# Patient Record
Sex: Female | Born: 1970 | Race: White | Hispanic: No | Marital: Single | Smoking: Never smoker
Health system: Southern US, Community
[De-identification: ages and names within clinical notes are randomized; demographics above are authoritative.]

## PROBLEM LIST (undated history)

## (undated) DIAGNOSIS — T7840XA Allergy, unspecified, initial encounter: Secondary | ICD-10-CM

## (undated) DIAGNOSIS — K219 Gastro-esophageal reflux disease without esophagitis: Secondary | ICD-10-CM

## (undated) DIAGNOSIS — F32A Depression, unspecified: Secondary | ICD-10-CM

## (undated) DIAGNOSIS — G4730 Sleep apnea, unspecified: Secondary | ICD-10-CM

## (undated) DIAGNOSIS — F329 Major depressive disorder, single episode, unspecified: Secondary | ICD-10-CM

## (undated) DIAGNOSIS — F909 Attention-deficit hyperactivity disorder, unspecified type: Secondary | ICD-10-CM

## (undated) DIAGNOSIS — J45909 Unspecified asthma, uncomplicated: Secondary | ICD-10-CM

## (undated) DIAGNOSIS — E559 Vitamin D deficiency, unspecified: Secondary | ICD-10-CM

## (undated) DIAGNOSIS — F419 Anxiety disorder, unspecified: Secondary | ICD-10-CM

## (undated) DIAGNOSIS — M1990 Unspecified osteoarthritis, unspecified site: Secondary | ICD-10-CM

## (undated) DIAGNOSIS — E119 Type 2 diabetes mellitus without complications: Secondary | ICD-10-CM

## (undated) DIAGNOSIS — E039 Hypothyroidism, unspecified: Secondary | ICD-10-CM

## (undated) HISTORY — DX: Major depressive disorder, single episode, unspecified: F32.9

## (undated) HISTORY — DX: Unspecified osteoarthritis, unspecified site: M19.90

## (undated) HISTORY — DX: Type 2 diabetes mellitus without complications: E11.9

## (undated) HISTORY — DX: Depression, unspecified: F32.A

## (undated) HISTORY — DX: Allergy, unspecified, initial encounter: T78.40XA

## (undated) HISTORY — DX: Vitamin D deficiency, unspecified: E55.9

---

## 1997-12-03 ENCOUNTER — Emergency Department (HOSPITAL_COMMUNITY): Admission: EM | Admit: 1997-12-03 | Discharge: 1997-12-03 | Payer: Self-pay | Admitting: Family Medicine

## 1997-12-05 ENCOUNTER — Encounter: Admission: RE | Admit: 1997-12-05 | Discharge: 1997-12-05 | Payer: Self-pay | Admitting: *Deleted

## 1997-12-09 ENCOUNTER — Emergency Department (HOSPITAL_COMMUNITY): Admission: EM | Admit: 1997-12-09 | Discharge: 1997-12-10 | Payer: Self-pay | Admitting: Emergency Medicine

## 2003-07-03 ENCOUNTER — Emergency Department (HOSPITAL_COMMUNITY): Admission: EM | Admit: 2003-07-03 | Discharge: 2003-07-03 | Payer: Self-pay | Admitting: Emergency Medicine

## 2003-07-05 ENCOUNTER — Ambulatory Visit (HOSPITAL_COMMUNITY): Admission: RE | Admit: 2003-07-05 | Discharge: 2003-07-05 | Payer: Self-pay | Admitting: Family Medicine

## 2004-02-20 ENCOUNTER — Emergency Department (HOSPITAL_COMMUNITY): Admission: EM | Admit: 2004-02-20 | Discharge: 2004-02-20 | Payer: Self-pay | Admitting: Emergency Medicine

## 2004-12-04 ENCOUNTER — Other Ambulatory Visit: Admission: RE | Admit: 2004-12-04 | Discharge: 2004-12-04 | Payer: Self-pay | Admitting: Family Medicine

## 2005-03-18 ENCOUNTER — Encounter: Admission: RE | Admit: 2005-03-18 | Discharge: 2005-03-18 | Payer: Self-pay | Admitting: Otolaryngology

## 2006-08-11 ENCOUNTER — Ambulatory Visit: Payer: Self-pay | Admitting: Family Medicine

## 2006-08-12 ENCOUNTER — Ambulatory Visit: Payer: Self-pay | Admitting: *Deleted

## 2006-09-01 ENCOUNTER — Ambulatory Visit: Payer: Self-pay | Admitting: Internal Medicine

## 2006-09-01 ENCOUNTER — Encounter (INDEPENDENT_AMBULATORY_CARE_PROVIDER_SITE_OTHER): Payer: Self-pay | Admitting: Family Medicine

## 2006-11-11 ENCOUNTER — Ambulatory Visit: Payer: Self-pay | Admitting: Family Medicine

## 2006-11-11 ENCOUNTER — Encounter (INDEPENDENT_AMBULATORY_CARE_PROVIDER_SITE_OTHER): Payer: Self-pay | Admitting: Family Medicine

## 2006-12-02 ENCOUNTER — Ambulatory Visit: Payer: Self-pay | Admitting: Family Medicine

## 2006-12-02 LAB — CONVERTED CEMR LAB
Free T4: 1.29 ng/dL (ref 0.89–1.80)
TSH: 7.456 microintl units/mL — ABNORMAL HIGH (ref 0.350–5.50)

## 2007-02-22 ENCOUNTER — Ambulatory Visit: Payer: Self-pay | Admitting: Family Medicine

## 2007-02-22 LAB — CONVERTED CEMR LAB: TSH: 7.145 microintl units/mL — ABNORMAL HIGH (ref 0.350–5.50)

## 2007-02-26 ENCOUNTER — Ambulatory Visit (HOSPITAL_COMMUNITY): Admission: RE | Admit: 2007-02-26 | Discharge: 2007-02-26 | Payer: Self-pay | Admitting: Family Medicine

## 2007-03-14 ENCOUNTER — Ambulatory Visit: Payer: Self-pay | Admitting: Family Medicine

## 2007-05-09 ENCOUNTER — Ambulatory Visit: Payer: Self-pay | Admitting: Family Medicine

## 2007-05-09 LAB — CONVERTED CEMR LAB: TSH: 1.539 microintl units/mL (ref 0.350–5.50)

## 2007-08-05 ENCOUNTER — Ambulatory Visit: Payer: Self-pay | Admitting: Internal Medicine

## 2007-09-06 ENCOUNTER — Ambulatory Visit: Payer: Self-pay | Admitting: Internal Medicine

## 2007-09-06 ENCOUNTER — Encounter (INDEPENDENT_AMBULATORY_CARE_PROVIDER_SITE_OTHER): Payer: Self-pay | Admitting: Family Medicine

## 2007-09-06 LAB — CONVERTED CEMR LAB
BUN: 10 mg/dL (ref 6–23)
Chloride: 105 meq/L (ref 96–112)
Creatinine, Ser: 0.55 mg/dL (ref 0.40–1.20)
Free T4: 1.24 ng/dL (ref 0.89–1.80)
Glucose, Bld: 97 mg/dL (ref 70–99)

## 2007-09-21 ENCOUNTER — Encounter (INDEPENDENT_AMBULATORY_CARE_PROVIDER_SITE_OTHER): Payer: Self-pay | Admitting: Family Medicine

## 2007-09-21 ENCOUNTER — Ambulatory Visit: Payer: Self-pay | Admitting: Internal Medicine

## 2007-09-21 LAB — CONVERTED CEMR LAB
LDL Cholesterol: 116 mg/dL — ABNORMAL HIGH (ref 0–99)
Total CHOL/HDL Ratio: 4.3
Triglycerides: 145 mg/dL (ref <150)

## 2007-09-26 ENCOUNTER — Emergency Department (HOSPITAL_COMMUNITY): Admission: EM | Admit: 2007-09-26 | Discharge: 2007-09-26 | Payer: Self-pay | Admitting: Emergency Medicine

## 2007-10-21 ENCOUNTER — Emergency Department (HOSPITAL_COMMUNITY): Admission: EM | Admit: 2007-10-21 | Discharge: 2007-10-21 | Payer: Self-pay | Admitting: Emergency Medicine

## 2008-01-31 ENCOUNTER — Encounter (INDEPENDENT_AMBULATORY_CARE_PROVIDER_SITE_OTHER): Payer: Self-pay | Admitting: Family Medicine

## 2008-01-31 ENCOUNTER — Ambulatory Visit: Payer: Self-pay | Admitting: Family Medicine

## 2008-01-31 LAB — CONVERTED CEMR LAB
Chlamydia, DNA Probe: NEGATIVE
GC Probe Amp, Genital: NEGATIVE

## 2008-03-30 ENCOUNTER — Ambulatory Visit: Payer: Self-pay | Admitting: Family Medicine

## 2008-04-16 ENCOUNTER — Emergency Department (HOSPITAL_COMMUNITY): Admission: EM | Admit: 2008-04-16 | Discharge: 2008-04-16 | Payer: Self-pay | Admitting: Emergency Medicine

## 2008-05-30 ENCOUNTER — Encounter: Admission: RE | Admit: 2008-05-30 | Discharge: 2008-05-30 | Payer: Self-pay | Admitting: Occupational Medicine

## 2008-08-25 ENCOUNTER — Ambulatory Visit (HOSPITAL_BASED_OUTPATIENT_CLINIC_OR_DEPARTMENT_OTHER): Admission: RE | Admit: 2008-08-25 | Discharge: 2008-08-25 | Payer: Self-pay | Admitting: Family Medicine

## 2008-09-02 ENCOUNTER — Ambulatory Visit: Payer: Self-pay | Admitting: Internal Medicine

## 2008-09-20 ENCOUNTER — Emergency Department (HOSPITAL_COMMUNITY): Admission: EM | Admit: 2008-09-20 | Discharge: 2008-09-20 | Payer: Self-pay | Admitting: Emergency Medicine

## 2008-09-25 ENCOUNTER — Emergency Department (HOSPITAL_COMMUNITY): Admission: EM | Admit: 2008-09-25 | Discharge: 2008-09-25 | Payer: Self-pay | Admitting: Emergency Medicine

## 2009-04-04 ENCOUNTER — Ambulatory Visit (HOSPITAL_COMMUNITY): Admission: RE | Admit: 2009-04-04 | Discharge: 2009-04-04 | Payer: Self-pay | Admitting: Family Medicine

## 2009-08-09 ENCOUNTER — Emergency Department (HOSPITAL_COMMUNITY): Admission: EM | Admit: 2009-08-09 | Discharge: 2009-08-09 | Payer: Self-pay | Admitting: Emergency Medicine

## 2010-03-04 IMAGING — CR DG ELBOW COMPLETE 3+V*L*
2 series · 2 of 2 positions shown · non-contrast
Comparison: None

CLINICAL DATA: Fell last night.  Left elbow pain.

LEFT ELBOW - COMPLETE 3+ VIEW

[view not recorded (1 of 2)]
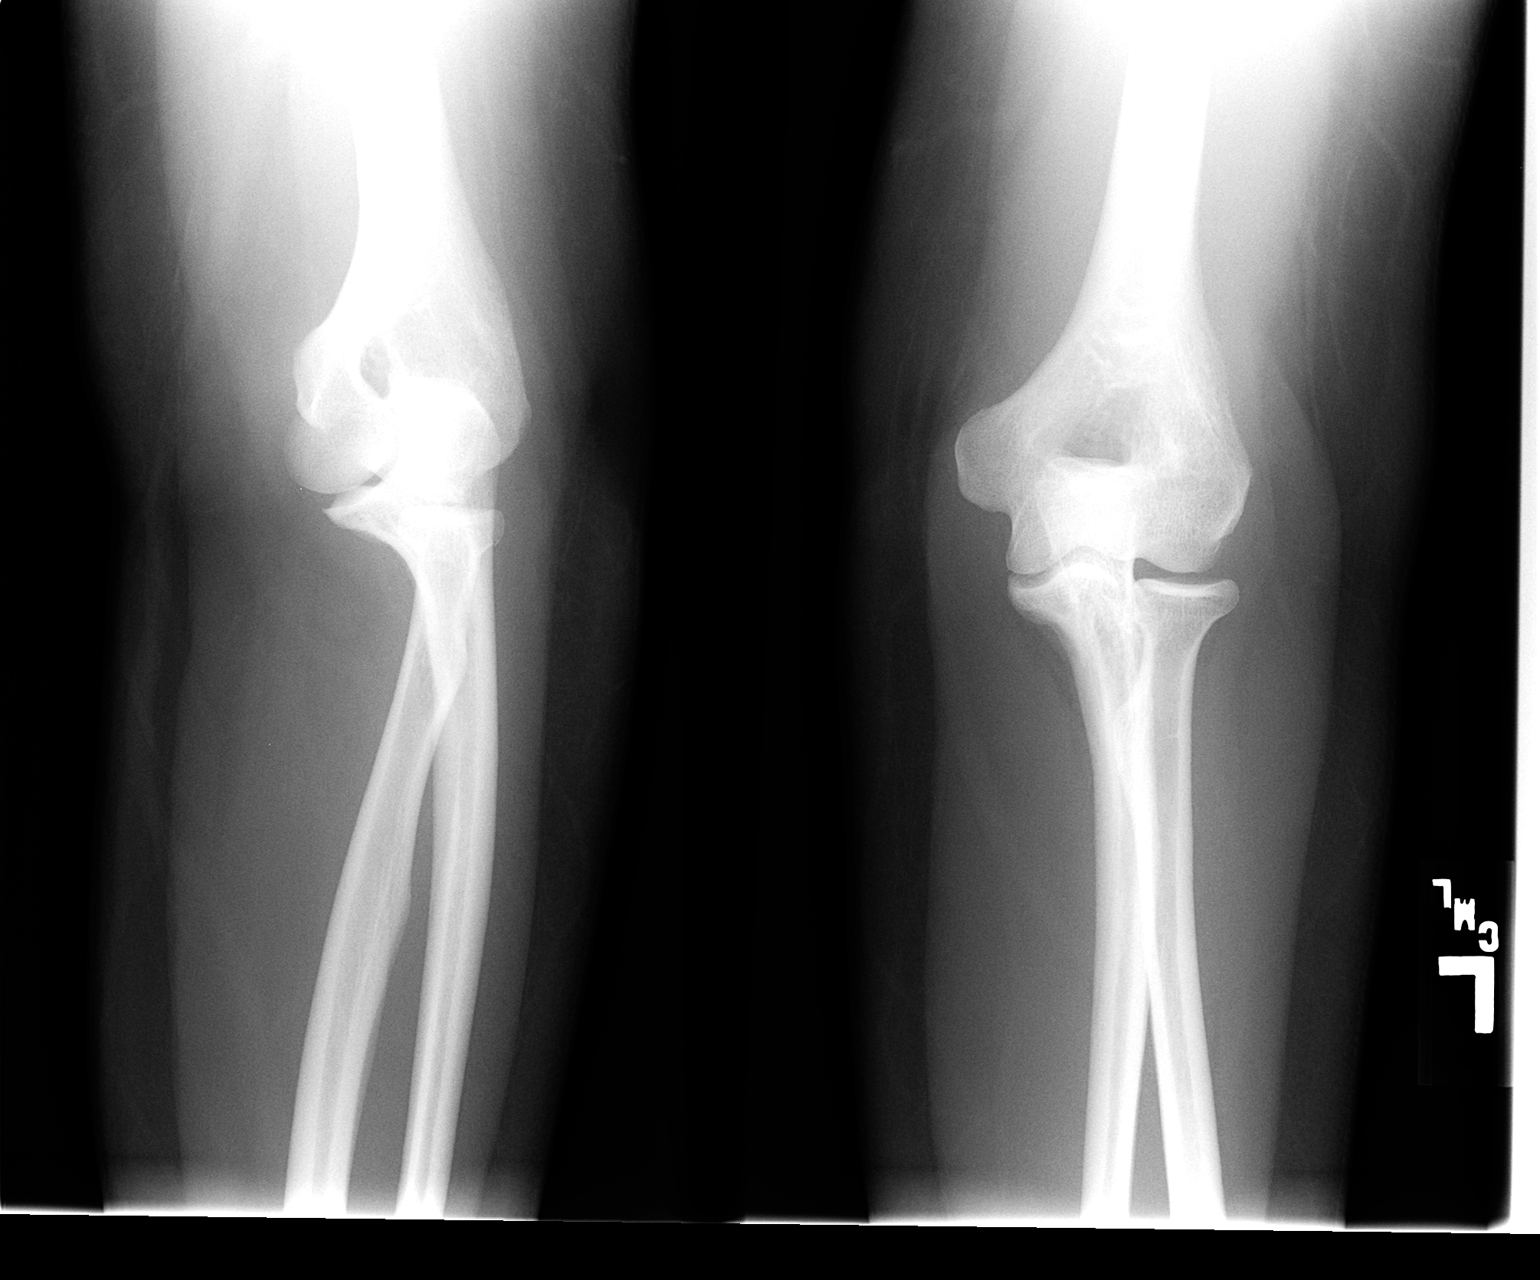

[view not recorded (2 of 2)]
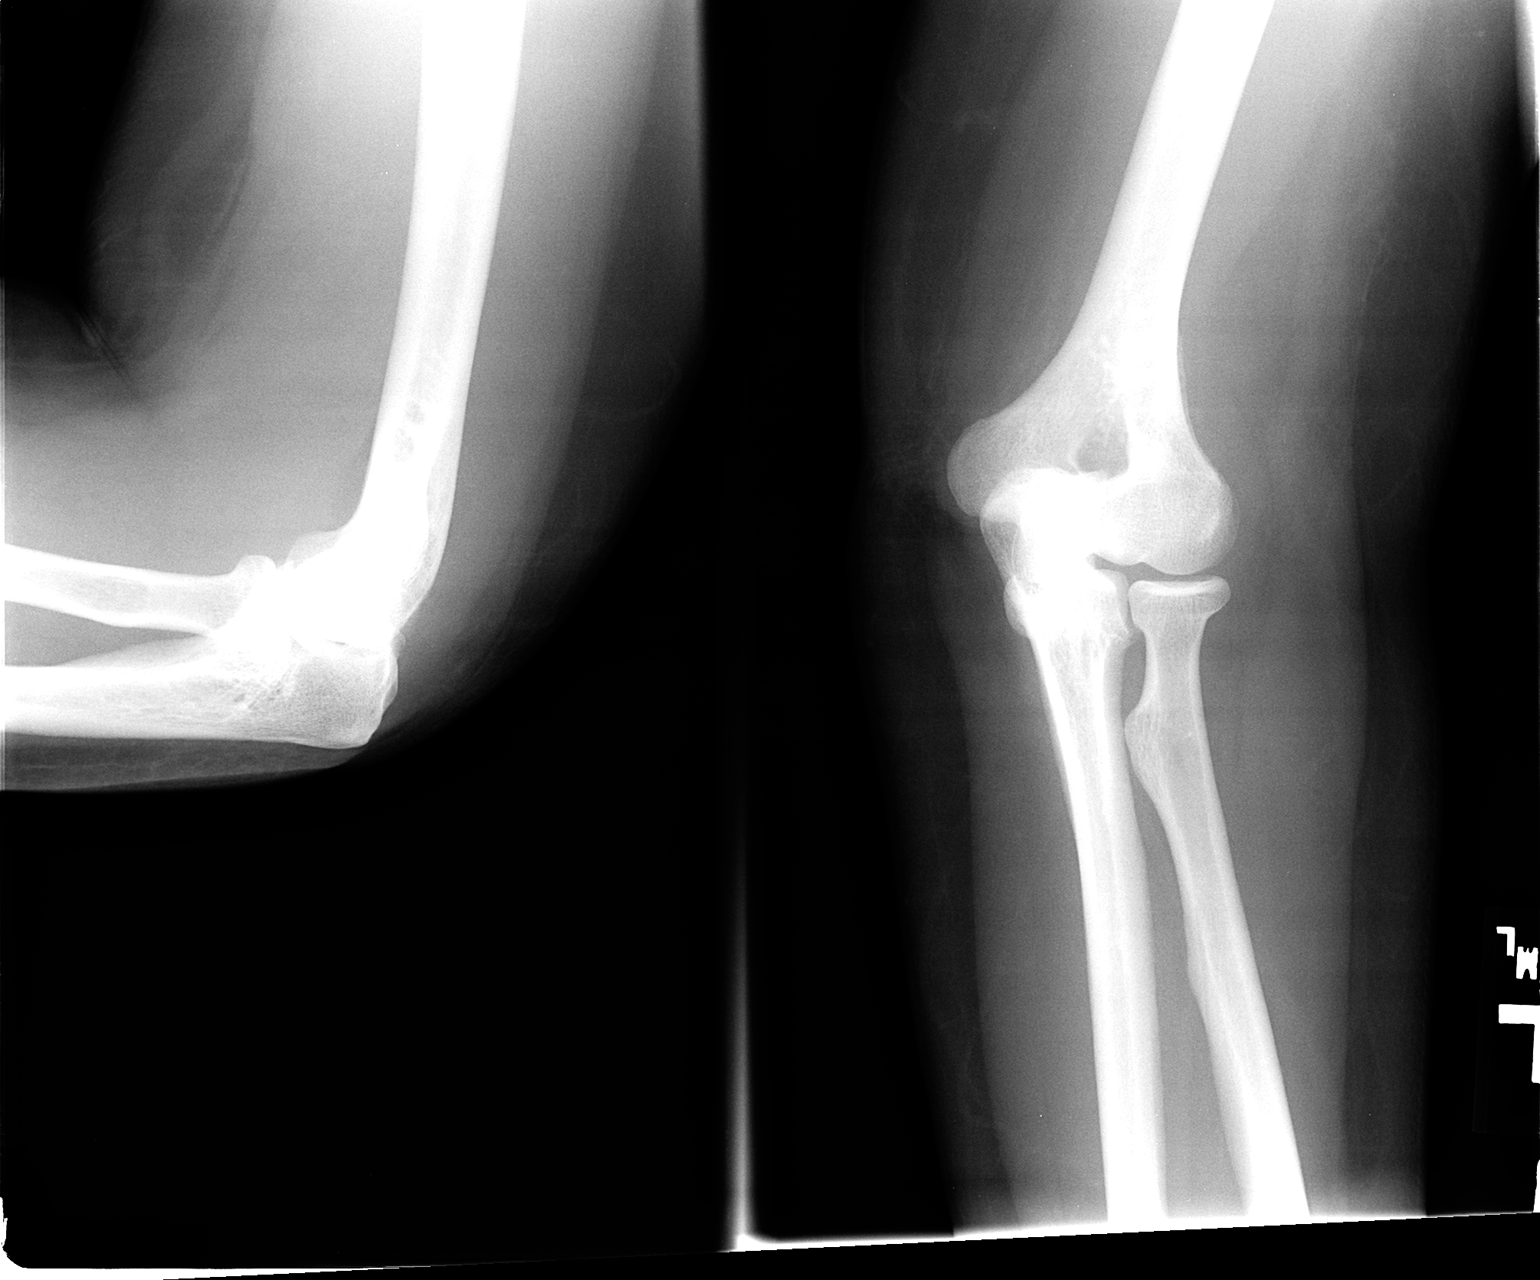

[2 of 2 positions shown; findings below may reference images not displayed]

FINDINGS: Prominent anterior fat pad but no evidence of a positive
posterior fat pad sign.  No fracture or subluxation is appreciated.
IMPRESSION: No acute bony abnormality.

## 2010-03-11 ENCOUNTER — Encounter (INDEPENDENT_AMBULATORY_CARE_PROVIDER_SITE_OTHER): Payer: Self-pay | Admitting: Family Medicine

## 2010-03-11 LAB — CONVERTED CEMR LAB
Albumin: 4.3 g/dL (ref 3.5–5.2)
Alkaline Phosphatase: 64 units/L (ref 39–117)
BUN: 11 mg/dL (ref 6–23)
Basophils Relative: 0 % (ref 0–1)
Calcium: 8.9 mg/dL (ref 8.4–10.5)
Chloride: 104 meq/L (ref 96–112)
Creatinine, Ser: 0.49 mg/dL (ref 0.40–1.20)
Eosinophils Absolute: 0.2 10*3/uL (ref 0.0–0.7)
Eosinophils Relative: 3 % (ref 0–5)
Free T4: 1.47 ng/dL (ref 0.80–1.80)
MCV: 86.2 fL (ref 78.0–100.0)
Monocytes Absolute: 0.5 10*3/uL (ref 0.1–1.0)
Monocytes Relative: 8 % (ref 3–12)
Potassium: 4.8 meq/L (ref 3.5–5.3)
TSH: 0.534 microintl units/mL (ref 0.350–4.500)
Vit D, 25-Hydroxy: 22 ng/mL — ABNORMAL LOW (ref 30–89)
WBC: 7.2 10*3/uL (ref 4.0–10.5)

## 2010-06-02 LAB — URINALYSIS, ROUTINE W REFLEX MICROSCOPIC
Nitrite: NEGATIVE
Urobilinogen, UA: 0.2 mg/dL (ref 0.0–1.0)

## 2010-06-02 LAB — CBC
HCT: 39.6 % (ref 36.0–46.0)
MCHC: 33.8 g/dL (ref 30.0–36.0)
MCV: 85.8 fL (ref 78.0–100.0)
Platelets: 292 10*3/uL (ref 150–400)
RBC: 4.62 MIL/uL (ref 3.87–5.11)
WBC: 9.8 10*3/uL (ref 4.0–10.5)

## 2010-06-02 LAB — URINE MICROSCOPIC-ADD ON

## 2010-06-02 LAB — WET PREP, GENITAL: Trich, Wet Prep: NONE SEEN

## 2010-06-02 LAB — GC/CHLAMYDIA PROBE AMP, GENITAL: GC Probe Amp, Genital: NEGATIVE

## 2010-08-01 NOTE — Procedures (Signed)
Kristi Ortiz, Kristi Ortiz              ACCOUNT NO.:  [id number]   MEDICAL RECORD NO.:  [id number]          PATIENT TYPE:  OUT   LOCATION:  SLEEP CENTER                 FACILITY:  [facility]   PHYSICIAN:  Clinton D. Maple Hudson, MD, FCCP, FACPDATE OF BIRTH:  Apr 26, 1971   DATE OF STUDY:                            NOCTURNAL POLYSOMNOGRAM   REFERRING PHYSICIAN:  Dario Guardian, M.D.   INDICATION FOR STUDY:  Hypersomnia with sleep apnea.   EPWORTH SLEEPINESS SCORE:  Epworth sleepiness score 14/24.  BMI 39.1.  Weight 235 pounds, height 65 inches.  Neck 16 inches.   MEDICATIONS:  Home medications are charted and reviewed.   SLEEP ARCHITECTURE:  Split study protocol.  During the diagnostic phase,  total sleep time 134 minutes with sleep efficiency 54.9%.  Stage I was  6%, stage II 74.3%, stage III absent, REM 19.8% of total sleep time.  Sleep latency 58.5 minutes, REM latency 84.5 minutes, awake-after-sleep  onset 37 minutes, arousal index 9.9.  No bedtime medication was taken.   RESPIRATORY DATA:  Split study protocol.  Apnea-hypopnea index (AHI)  89.1 per hour.  A total of 199 events were scored, all as hypopneas.  All events were associated with supine sleep.  REM AHI 74.7.  CPAP was  then titrated to 15 CWP, AHI 1 per hour.  She wore a small ResMed full-  face Quattro mask with heated humidifier.  Technician commented on  difficulty with titration because the patient was awake for long  periods.   OXYGEN DATA:  Loud snoring with oxygen desaturation before CPAP to a  nadir of 79%.  After CPAP control, snoring was prevented and oxygen  saturation held at the mean of 96.3% on room air.   CARDIAC DATA:  Normal sinus rhythm.   MOVEMENT-PARASOMNIA:  No movement disturbance.  Bathroom x1.   IMPRESSIONS-RECOMMENDATIONS:  1. Severe obstructive sleep apnea/hypopnea syndrome, AHI 89.1 per      hour.  All events were hypopneas associated with supine sleep.      Loud snoring with oxygen desaturation to  a nadir of 79%.  2. Successful CPAP titration to 15 CWP, AHI 1 per hour.  She wore a      small ResMed full-face Quattro mask with heated humidifier.      Clinton D. Maple Hudson, MD, [facility], FACP  Diplomate, Biomedical engineer of Sleep Medicine  Electronically Signed     CDY/MEDQ  D:  09/01/2008 10:53:23  T:  09/02/2008 00:03:23  Job:  [id number]

## 2011-11-19 ENCOUNTER — Encounter (HOSPITAL_COMMUNITY): Payer: Self-pay | Admitting: *Deleted

## 2011-11-19 ENCOUNTER — Emergency Department (INDEPENDENT_AMBULATORY_CARE_PROVIDER_SITE_OTHER)
Admission: EM | Admit: 2011-11-19 | Discharge: 2011-11-19 | Disposition: A | Discharge status: Home / Self Care | Payer: Self-pay | Source: Home / Self Care

## 2011-11-19 DIAGNOSIS — R0789 Other chest pain: Secondary | ICD-10-CM

## 2011-11-19 DIAGNOSIS — J069 Acute upper respiratory infection, unspecified: Secondary | ICD-10-CM

## 2011-11-19 DIAGNOSIS — R071 Chest pain on breathing: Secondary | ICD-10-CM

## 2011-11-19 HISTORY — DX: Unspecified asthma, uncomplicated: J45.909

## 2011-11-19 HISTORY — DX: Hypothyroidism, unspecified: E03.9

## 2011-11-19 NOTE — ED Provider Notes (Signed)
Medical screening examination/treatment/procedure(s) were performed by non-physician practitioner and as supervising physician I was immediately available for consultation/collaboration.  Raynald Blend, MD 11/19/11 1859

## 2011-11-19 NOTE — ED Provider Notes (Signed)
Medical screening examination/treatment/procedure(s) were performed by non-physician practitioner and as supervising physician I was immediately available for consultation/collaboration.  Cyerra Yim   Lelon Ikard, MD 11/19/11 1411 

## 2011-11-19 NOTE — ED Notes (Signed)
Pt is here with complaints of 1 month history of sore throat, non-productive cough and fever.  States temp 2 days ago was 101.2, has been using Aleve.

## 2011-11-19 NOTE — ED Provider Notes (Addendum)
History     CSN: [id number]  Arrival date & time 11/19/11  1009   None     Chief Complaint  Patient presents with  . Sore Throat  . Cough  . Fever    (Consider location/radiation/quality/duration/timing/severity/associated sxs/prior treatment) HPI Comments: C/o chest pain in anterior chest.   Patient is a 41 y.o. female presenting with URI. The history is provided by a parent.  URI The primary symptoms include fever, sore throat and cough. Primary symptoms do not include fatigue, headaches, ear pain, swollen glands, wheezing, abdominal pain, nausea, vomiting, myalgias, arthralgias or rash. The current episode started 6 to 7 days ago. This is a new problem.  Symptoms associated with the illness include congestion and rhinorrhea. The illness is not associated with chills, plugged ear sensation, facial pain or sinus pressure.    Past Medical History  Diagnosis Date  . Asthma   . Hypothyroid     History reviewed. No pertinent past surgical history.  History reviewed. No pertinent family history.  History  Substance Use Topics  . Smoking status: Never Smoker   . Smokeless tobacco: Not on file  . Alcohol Use: No    OB History    Grav Para Term Preterm Abortions TAB SAB Ect Mult Living                  Review of Systems  Constitutional: Positive for fever. Negative for chills and fatigue.  HENT: Positive for congestion, sore throat and rhinorrhea. Negative for ear pain and sinus pressure.   Eyes: Positive for itching. Negative for pain and redness.  Respiratory: Positive for cough. Negative for wheezing.   Cardiovascular: Negative for chest pain and leg swelling.  Gastrointestinal: Negative.  Negative for nausea, vomiting and abdominal pain.  Genitourinary: Negative.   Musculoskeletal: Negative for myalgias and arthralgias.  Skin: Negative for rash.  Neurological: Negative.  Negative for headaches.    Allergies  Review of patient's allergies indicates no known  allergies.  Home Medications   Current Outpatient Rx  Name Route Sig Dispense Refill  . LEVOTHYROXINE SODIUM 125 MCG PO TABS Oral Take 125 mcg by mouth daily.      BP 117/82  Pulse 105  Temp 98.5 F (36.9 C) (Oral)  Resp 18  SpO2 96%  LMP 10/29/2011  Physical Exam  Constitutional: She is oriented to person, place, and time. She appears well-developed and well-nourished. No distress.  HENT:  Right Ear: External ear normal.  Left Ear: External ear normal.  Mouth/Throat: Oropharynx is clear and moist. No oropharyngeal exudate.  Eyes: Conjunctivae are normal. Pupils are equal, round, and reactive to light.  Neck: Normal range of motion. Neck supple.  Cardiovascular: Normal rate.        Split S1   Pulmonary/Chest: Effort normal and breath sounds normal.  Musculoskeletal: Normal range of motion. She exhibits no tenderness.  Lymphadenopathy:    She has no cervical adenopathy.  Neurological: She is alert and oriented to person, place, and time.  Skin: Skin is warm and dry. She is not diaphoretic.  Psychiatric: She has a normal mood and affect.    ED Course  Procedures (including critical care time)   Labs Reviewed  POCT RAPID STREP A ([facility]RG CARE ONLY)   No results found.   1. URI (upper respiratory infection)   2. Chest wall pain       MDM  Claritin 10mg  q d, sudafed 10mg  q 4h prn congestion, Motrin 400mg  q 6h prn  chest wall pain.  Fluids, rest.  Strep test is Negative          Hayden Rasmussen, NP 11/19/11 1115  Hayden Rasmussen, NP 11/19/11 1605  Hayden Rasmussen, NP 11/19/11 1606

## 2012-08-11 ENCOUNTER — Ambulatory Visit: Payer: No Typology Code available for payment source | Attending: Family Medicine | Admitting: Internal Medicine

## 2012-08-11 VITALS — BP 133/81 | HR 77 | Temp 97.7°F | Resp 18 | Wt 268.8 lb

## 2012-08-11 DIAGNOSIS — E039 Hypothyroidism, unspecified: Secondary | ICD-10-CM | POA: Insufficient documentation

## 2012-08-11 DIAGNOSIS — Z91199 Patient's noncompliance with other medical treatment and regimen due to unspecified reason: Secondary | ICD-10-CM | POA: Insufficient documentation

## 2012-08-11 DIAGNOSIS — G4733 Obstructive sleep apnea (adult) (pediatric): Secondary | ICD-10-CM | POA: Insufficient documentation

## 2012-08-11 DIAGNOSIS — F329 Major depressive disorder, single episode, unspecified: Secondary | ICD-10-CM | POA: Insufficient documentation

## 2012-08-11 DIAGNOSIS — Z9119 Patient's noncompliance with other medical treatment and regimen: Secondary | ICD-10-CM | POA: Insufficient documentation

## 2012-08-11 DIAGNOSIS — J45909 Unspecified asthma, uncomplicated: Secondary | ICD-10-CM | POA: Insufficient documentation

## 2012-08-11 DIAGNOSIS — F3289 Other specified depressive episodes: Secondary | ICD-10-CM | POA: Insufficient documentation

## 2012-08-11 MED ORDER — LEVOTHYROXINE SODIUM 125 MCG PO TABS: 125.0000 ug | ORAL_TABLET | Freq: Every day | ORAL | Status: DC

## 2012-08-11 NOTE — Progress Notes (Signed)
Patient states has a thyroid problem Suffers from depression States she used to take citalopram but does not know the dose

## 2012-08-11 NOTE — Progress Notes (Signed)
Patient ID: Kristi Ortiz, female   DOB: [birth year], 42 y.o.   MRN: [id number] Patient Demographics  Kristi Ortiz, is a 42 y.o. female  WUJ:[id number]  NFA:[id number]  DOB - [birth year]  Chief Complaint  Patient presents with  . Depression  . Thyroid Problem        Subjective:   [name] today is here to establish primary care. Patient has a  Past Medical History  Diagnosis Date  . Asthma   . Hypothyroid   . Currently patient has no complaints. Patient has been out of Synthroid for the past 2 months, she also has a history of depression and was taking Celexa to a few weeks ago and has stopped taking them. She apparently sees a psychiatrist at [facility], and is planning to go seeing him to see if she needs to go back on Celexa.  She also has a history of obstructive sleep apnea and was on CPAP till 2 years ago, she claims the machine broke and has not had it replaced yet.unfortunately she is not interested in using CPAP again, she wants to try to lose weight. I advised her to risk of worsening shortness of breath, sudden death, A. fib and hypertension. Unfortunately she would like to see if her symptoms with this improve with weight loss  Patient has also has No headache, No chest pain, No abdominal pain,No Nausea, No new weakness tingling or numbness, No Cough or SOB.   Objective:    Filed Vitals:   08/11/12 1117  BP: 133/81  Pulse: 77  Temp: 97.7 F (36.5 C)  Resp: 18  Weight: 268 lb 12.8 oz (121.927 kg)  SpO2: 95%     ALLERGIES:  No Known Allergies  PAST MEDICAL HISTORY: Past Medical History  Diagnosis Date  . Asthma   . Hypothyroid     PAST SURGICAL HISTORY: No past surgical history on file.  FAMILY HISTORY: No family history of CAD  MEDICATIONS AT HOME: Prior to Admission medications   Medication Sig Start Date End Date Taking? Authorizing Provider  levothyroxine (SYNTHROID, LEVOTHROID) 125 MCG tablet Take 1 tablet (125 mcg total) by mouth  daily. 08/11/12   Shanker Levora Dredge, MD    SOCIAL HISTORY:   reports that she has never smoked. She does not have any smokeless tobacco history on file. She reports that she does not drink alcohol or use illicit drugs.  REVIEW OF SYSTEMS:  Constitutional:   No   Fevers, chills, fatigue.  HEENT:    No headaches, Sore throat,   Cardio-vascular: No chest pain,  Orthopnea, swelling in lower extremities, anasarca, palpitations  GI:  No abdominal pain, nausea, vomiting, diarrhea  Resp: No shortness of breath,  No coughing up of blood.No cough.No wheezing.  Skin:  no rash or lesions.  GU:  no dysuria, change in color of urine, no urgency or frequency.  No flank pain.  Musculoskeletal: No joint pain or swelling.  No decreased range of motion.  No back pain.  Psych: No change in mood or affect. No depression or anxiety.  No memory loss.   Exam  General appearance :Awake, alert, not in any distress. Speech Clear. Not toxic Looking HEENT: Atraumatic and Normocephalic, pupils equally reactive to light and accomodation Neck: supple, no JVD. No cervical lymphadenopathy.  Chest:Good air entry bilaterally, no added sounds  CVS: S1 S2 regular, no murmurs.  Abdomen: Bowel sounds present, Non tender and not distended with no gaurding, rigidity or rebound. Extremities: B/L Lower Ext shows no  edema, both legs are warm to touch Neurology: Awake alert, and oriented X 3, CN II-XII intact, Non focal Skin:No Rash Wounds:N/A    Data Review   CBC No results found for this basename: WBC, HGB, HCT, PLT, MCV, MCH, MCHC, RDW, NEUTRABS, LYMPHSABS, MONOABS, EOSABS, BASOSABS, BANDABS, BANDSABD,  in the last 168 hours  Chemistries   No results found for this basename: NA, K, CL, CO2, GLUCOSE, BUN, CREATININE, GFRCGP, CALCIUM, MG, AST, ALT, ALKPHOS, BILITOT,  in the last 168  hours ------------------------------------------------------------------------------------------------------------------ No results found for this basename: HGBA1C,  in the last 72 hours ------------------------------------------------------------------------------------------------------------------ No results found for this basename: CHOL, HDL, LDLCALC, TRIG, CHOLHDL, LDLDIRECT,  in the last 72 hours ------------------------------------------------------------------------------------------------------------------ No results found for this basename: TSH, T4TOTAL, FREET3, T3FREE, THYROIDAB,  in the last 72 hours ------------------------------------------------------------------------------------------------------------------ No results found for this basename: VITAMINB12, FOLATE, FERRITIN, TIBC, IRON, RETICCTPCT,  in the last 72 hours  Coagulation profile  No results found for this basename: INR, PROTIME,  in the last 168 hours    Assessment & Plan   #1 hypothyroidism - Check TSH today - Resume on and 25 mcg of Synthroid - Return to clinic in 3 weeks for lab check  #2 depression - Not interested in restarting Celexa, she claims she will followup with a psychiatrist. We agreed that we will only manage hypothyroid and other medical issues here in the clinic, her psychiatrist will manage her psychiatric issues.  #3 obstructive sleep apnea - Noncompliant with CPAP-not interested in resuming the CPAP again. Please see above  We'll get basic labs-CBC, cemented, TSH, A1c and lipids and then back to clinic in 3 weeks.

## 2012-08-12 ENCOUNTER — Telehealth: Payer: Self-pay | Admitting: *Deleted

## 2012-08-12 LAB — TSH: TSH: 135.221 u[IU]/mL — ABNORMAL HIGH (ref 0.350–4.500)

## 2012-08-12 NOTE — Telephone Encounter (Signed)
08/12/12 Patient walked into clinic stated that pharmacy would Not filled her prescription for Synthroid until they speak with MD Dr. Gean Birchwood spoke with [facility] pharmacy for clarification.Patient  Instructed to start taking medication today per Dr. Gean Birchwood patient verbalize And understand.  P.Wiliams,RN BSN MHA

## 2012-08-16 ENCOUNTER — Telehealth: Payer: Self-pay | Admitting: Family Medicine

## 2012-09-01 ENCOUNTER — Encounter: Payer: Self-pay | Admitting: Internal Medicine

## 2012-09-01 ENCOUNTER — Encounter (HOSPITAL_COMMUNITY): Payer: Self-pay | Admitting: Emergency Medicine

## 2012-09-01 ENCOUNTER — Observation Stay (HOSPITAL_COMMUNITY): Payer: No Typology Code available for payment source

## 2012-09-01 ENCOUNTER — Ambulatory Visit: Payer: No Typology Code available for payment source | Attending: Family Medicine | Admitting: Internal Medicine

## 2012-09-01 ENCOUNTER — Telehealth: Payer: Self-pay | Admitting: Family Medicine

## 2012-09-01 ENCOUNTER — Observation Stay (HOSPITAL_COMMUNITY)
Admission: EM | Admit: 2012-09-01 | Discharge: 2012-09-03 | Disposition: A | Discharge status: Home / Self Care | Payer: No Typology Code available for payment source | Attending: Internal Medicine | Admitting: Internal Medicine

## 2012-09-01 VITALS — BP 117/84 | HR 79 | Temp 97.9°F | Ht 65.0 in | Wt 264.4 lb

## 2012-09-01 DIAGNOSIS — R404 Transient alteration of awareness: Secondary | ICD-10-CM | POA: Insufficient documentation

## 2012-09-01 DIAGNOSIS — R609 Edema, unspecified: Secondary | ICD-10-CM | POA: Diagnosis present

## 2012-09-01 DIAGNOSIS — Z79899 Other long term (current) drug therapy: Secondary | ICD-10-CM | POA: Insufficient documentation

## 2012-09-01 DIAGNOSIS — Z91199 Patient's noncompliance with other medical treatment and regimen due to unspecified reason: Secondary | ICD-10-CM | POA: Insufficient documentation

## 2012-09-01 DIAGNOSIS — G4733 Obstructive sleep apnea (adult) (pediatric): Secondary | ICD-10-CM | POA: Insufficient documentation

## 2012-09-01 DIAGNOSIS — R413 Other amnesia: Secondary | ICD-10-CM | POA: Diagnosis present

## 2012-09-01 DIAGNOSIS — E039 Hypothyroidism, unspecified: Principal | ICD-10-CM | POA: Insufficient documentation

## 2012-09-01 DIAGNOSIS — R209 Unspecified disturbances of skin sensation: Secondary | ICD-10-CM | POA: Insufficient documentation

## 2012-09-01 DIAGNOSIS — R5383 Other fatigue: Secondary | ICD-10-CM | POA: Insufficient documentation

## 2012-09-01 DIAGNOSIS — Z9119 Patient's noncompliance with other medical treatment and regimen: Secondary | ICD-10-CM | POA: Insufficient documentation

## 2012-09-01 DIAGNOSIS — R5381 Other malaise: Secondary | ICD-10-CM | POA: Insufficient documentation

## 2012-09-01 DIAGNOSIS — J45909 Unspecified asthma, uncomplicated: Secondary | ICD-10-CM | POA: Insufficient documentation

## 2012-09-01 HISTORY — DX: Sleep apnea, unspecified: G47.30

## 2012-09-01 LAB — CREATININE, SERUM
Creatinine, Ser: 0.61 mg/dL (ref 0.50–1.10)
GFR calc Af Amer: >90 mL/min (ref >90)
GFR calc non Af Amer: >90 mL/min (ref >90)

## 2012-09-01 LAB — TSH: TSH: 5.908 u[IU]/mL — ABNORMAL HIGH (ref 0.350–4.500)

## 2012-09-01 LAB — CBC WITH DIFFERENTIAL/PLATELET
Basophils Absolute: 0 10*3/uL (ref 0.0–0.1)
Basophils Relative: 0 % (ref 0–1)
Eosinophils Relative: 4 % (ref 0–5)
HCT: 39.2 % (ref 36.0–46.0)
MCHC: 33.4 g/dL (ref 30.0–36.0)
MCV: 86.3 fL (ref 78.0–100.0)
Monocytes Absolute: 0.6 10*3/uL (ref 0.1–1.0)
Neutro Abs: 5 10*3/uL (ref 1.7–7.7)
Platelets: 260 10*3/uL (ref 150–400)
RDW: 15.4 % (ref 11.5–15.5)

## 2012-09-01 LAB — CBC
HCT: 39 % (ref 36.0–46.0)
Hemoglobin: 13.1 g/dL (ref 12.0–15.0)
MCHC: 33.6 g/dL (ref 30.0–36.0)
RBC: 4.53 MIL/uL (ref 3.87–5.11)
WBC: 8.3 10*3/uL (ref 4.0–10.5)

## 2012-09-01 LAB — BASIC METABOLIC PANEL
Calcium: 8.9 mg/dL (ref 8.4–10.5)
Creatinine, Ser: 0.54 mg/dL (ref 0.50–1.10)
GFR calc Af Amer: >90 mL/min (ref >90)
Sodium: 137 mEq/L (ref 135–145)

## 2012-09-01 LAB — ALBUMIN: Albumin: 3.7 g/dL (ref 3.5–5.2)

## 2012-09-01 MED ORDER — ALBUTEROL SULFATE (5 MG/ML) 0.5% IN NEBU
2.5000 mg | INHALATION_SOLUTION | RESPIRATORY_TRACT | Status: DC | PRN
  Administered 2012-09-01: 2.5 mg via RESPIRATORY_TRACT
  Filled 2012-09-01: qty 0.5

## 2012-09-01 MED ORDER — SODIUM CHLORIDE 0.9 % IJ SOLN
3.0000 mL | Freq: Two times a day (BID) | INTRAMUSCULAR | Status: DC
  Administered 2012-09-01 – 2012-09-02 (×2): 3 mL via INTRAVENOUS

## 2012-09-01 MED ORDER — ONDANSETRON HCL 4 MG/2ML IJ SOLN: 4.0000 mg | Freq: Three times a day (TID) | INTRAMUSCULAR | Status: DC | PRN

## 2012-09-01 MED ORDER — ACETAMINOPHEN 325 MG PO TABS
650.0000 mg | ORAL_TABLET | Freq: Four times a day (QID) | ORAL | Status: DC | PRN
  Administered 2012-09-01 – 2012-09-03 (×2): 650 mg via ORAL
  Filled 2012-09-01 (×2): qty 2

## 2012-09-01 MED ORDER — ONDANSETRON HCL 4 MG PO TABS: 4.0000 mg | ORAL_TABLET | Freq: Four times a day (QID) | ORAL | Status: DC | PRN

## 2012-09-01 MED ORDER — OXYCODONE HCL 5 MG PO TABS: 5.0000 mg | ORAL_TABLET | ORAL | Status: DC | PRN

## 2012-09-01 MED ORDER — MORPHINE SULFATE 2 MG/ML IJ SOLN: 2.0000 mg | INTRAMUSCULAR | Status: DC | PRN

## 2012-09-01 MED ORDER — ACETAMINOPHEN 650 MG RE SUPP: 650.0000 mg | Freq: Four times a day (QID) | RECTAL | Status: DC | PRN

## 2012-09-01 MED ORDER — HEPARIN SODIUM (PORCINE) 5000 UNIT/ML IJ SOLN
5000.0000 [IU] | Freq: Three times a day (TID) | INTRAMUSCULAR | Status: DC
  Administered 2012-09-01 – 2012-09-03 (×5): 5000 [IU] via SUBCUTANEOUS
  Filled 2012-09-01 (×8): qty 1

## 2012-09-01 MED ORDER — LEVOTHYROXINE SODIUM 125 MCG PO TABS
125.0000 ug | ORAL_TABLET | Freq: Every day | ORAL | Status: DC
  Administered 2012-09-02 – 2012-09-03 (×2): 125 ug via ORAL
  Filled 2012-09-01 (×3): qty 1

## 2012-09-01 MED ORDER — SODIUM CHLORIDE 0.9 % IJ SOLN: 3.0000 mL | INTRAMUSCULAR | Status: DC | PRN

## 2012-09-01 MED ORDER — ONDANSETRON HCL 4 MG/2ML IJ SOLN: 4.0000 mg | Freq: Four times a day (QID) | INTRAMUSCULAR | Status: DC | PRN

## 2012-09-01 MED ORDER — SODIUM CHLORIDE 0.9 % IV SOLN: 250.0000 mL | INTRAVENOUS | Status: DC | PRN

## 2012-09-01 NOTE — ED Notes (Signed)
Pt states she was off synthroid for 2 months and restarted synthroid a week ago.   Pt states she has been having intermittent lower back pain, fatigue, arm and leg soreness, foot and leg cramps, hand numbness after holding objects for extended time.  Pt states her legs are sensitive and have intermittent numbness. Pt states knees and feet are swollen.  Pt also states she has been having anxiety.  Pt states she was sent here from wellness clinic.

## 2012-09-01 NOTE — Progress Notes (Signed)
Pt placed on Auto CPAP via nasal mask, Pt tolerating well at this time, RT to monitor and assess as needed.

## 2012-09-01 NOTE — Patient Instructions (Signed)

## 2012-09-01 NOTE — Progress Notes (Signed)
   CARE MANAGEMENT ED NOTE 09/01/2012  Patient:  Kristi Ortiz, Kristi Ortiz   Account Number:  [id number]  Date Initiated:  09/01/2012  Documentation initiated by:  Radford Pax  Subjective/Objective Assessment:     Subjective/Objective Assessment Detail:     Action/Plan:   Action/Plan Detail:   Anticipated DC Date:       Status Recommendation to Physician:   Result of Recommendation:    Other ED Services  Consult Working Plan    DC Planning Services  CM consult  Other  Medication Assistance    Choice offered to / List presented to:            Status of service:  Completed, signed off  ED Comments:   ED Comments Detail:  [facility] consulted to see patient reagarding medication assistance and a new CPAP machine.  Patient informed EDCM that she gets her medications from [facility] for four dollars.  She also has discount prescription cards. Patient informed EDCM that she is currently on one medication.  Patient informed EDCM that she has not used her CPAP machine in a "long time."  [facility] informed patient that she may have to get another sleep apnea test before she gets a new machine because her sleep apnea could have gotten worse or better.  Patient verbalized understanding. [facility] provided patient with list of community resources for financial assistance.  Patient and family thankful for assistance.  No further needs at this time.

## 2012-09-01 NOTE — Progress Notes (Signed)
Patient ID: Kristi Ortiz, female   DOB: 11-Jul-1970, 42 y.o.   MRN: [id number]  CC: follow up TSH level  HPI: 42 year old female with past medical history of hypothyroidism and asthma who presented to our clinic for followup of TSH level. Patient reported extreme pain in the lower back especially in the morning when she wakes up and this happens at least twice a week. Patient reports fatigue, soreness in arms and legs, foot and leg cramps. Additional symptoms are tingling sensation in hands and feet and numbness in hands after holding objects for extended period of time. Patient also reports dropping items after holding of her period of time. Patient reports increasing swelling in the knees and feet especially on the left side of the body. She reports increasing headaches in the morning for the past couple of weeks. In addition, other complaints or easy frustration when trying to do something and something unexpected occurs. She reports trouble remembering faces and names of people she met and worked with in recent past. She reports trouble remembering certain words. Patient reports problems with speech and getting words and thoughts and out as well as easy distractibility. Note, about 3 weeks prior to this presentation patient came to our office for TSH check up which turns out to be 135. Prior to that 3 weeks she ran out of levothyroxine for about 2 months. She was given a prescription for levothyroxine 125 mcg daily which she started taking after our visit 3 weeks ago, 08/11/2012.  No Known Allergies Past Medical History  Diagnosis Date  . Asthma   . Hypothyroid    Current Outpatient Prescriptions on File Prior to Visit  Medication Sig Dispense Refill  . levothyroxine (SYNTHROID, LEVOTHROID) 125 MCG tablet Take 1 tablet (125 mcg total) by mouth daily.  30 tablet  2   No current facility-administered medications on file prior to visit.   Family medical history significant for HTN,  HLD   History   Social History  . Marital Status: Single    Spouse Name: N/A    Number of Children: N/A  . Years of Education: N/A   Occupational History  . Not on file.   Social History Main Topics  . Smoking status: Never Smoker   . Smokeless tobacco: Not on file  . Alcohol Use: No  . Drug Use: No  . Sexually Active: Not on file   Other Topics Concern  . Not on file   Social History Narrative  . No narrative on file    Review of Systems  Constitutional: Negative for fever, chills, diaphoresis, activity change, appetite change and fatigue.  HENT: Negative for ear pain, nosebleeds, congestion, facial swelling, rhinorrhea, neck pain, neck stiffness and ear discharge.   Eyes: Negative for pain, discharge, redness, itching and visual disturbance.  Respiratory: Negative for cough, choking, chest tightness, shortness of breath, wheezing and stridor.   Cardiovascular: Negative for chest pain, palpitations and leg swelling.  Gastrointestinal: Negative for abdominal distention.  Genitourinary: Negative for dysuria, urgency, frequency, hematuria, flank pain, decreased urine volume, difficulty urinating and dyspareunia.  Musculoskeletal: Positive for lower back pain. Positive for soreness in arms and legs, foot and leg cramps.  Neurological: Positive for tingling sensation in hands and feet, numbness in hands; memory loss; speech difficulty  Hematological: Negative for adenopathy. Does not bruise/bleed easily.  Psychiatric/Behavioral: Positive for distractibility, depression    Objective:   Filed Vitals:   09/01/12 1033  BP: 117/84  Pulse: 79  Temp: 97.9 F (36.6  C)    Physical Exam  Constitutional: Appears well-developed and well-nourished. No distress. Slow speech, obese HENT: Normocephalic. External right and left ear normal. Oropharynx is clear and moist.  Eyes: Conjunctivae and EOM are normal. PERRLA, no scleral icterus.  Neck: Normal ROM. Neck supple. No JVD. No  tracheal deviation. No thyromegaly.  CVS: RRR, S1/S2 +, no murmurs, no gallops, no carotid bruit.  Pulmonary: Effort and breath sounds normal, no stridor, rhonchi, wheezes, rales.  Abdominal: Soft. BS +,  no distension, tenderness, rebound or guarding.  Musculoskeletal: Normal range of motion. No edema and no tenderness.  Lymphadenopathy: No lymphadenopathy noted, cervical, inguinal. Neuro: Alert. Normal reflexes, muscle tone coordination. No cranial nerve deficit. Skin: Skin is warm and dry. No rash noted. Not diaphoretic. No erythema. No pallor.  Psychiatric: Normal mood and affect. Behavior, judgment, thought content normal.   Lab Results  Component Value Date   WBC 7.2 03/11/2010   HGB 14.4 03/11/2010   HCT 44.3 03/11/2010   MCV 86.2 03/11/2010   PLT 323 03/11/2010   Lab Results  Component Value Date   CREATININE 0.49 03/11/2010   BUN 11 03/11/2010   NA 141 03/11/2010   K 4.8 03/11/2010   CL 104 03/11/2010   CO2 26 03/11/2010    No results found for this basename: HGBA1C   Lipid Panel     Component Value Date/Time   CHOL 189 09/21/2007 2042   TRIG 145 09/21/2007 2042   HDL 44 09/21/2007 2042   CHOLHDL 4.3 Ratio 09/21/2007 2042   VLDL 29 09/21/2007 2042   LDLCALC 116* 09/21/2007 2042       Assessment and plan:   Patient Active Problem List   Diagnosis Date Noted  . Unspecified hypothyroidism - TSH level 08/11/2012 was 135. Patient was instructed to go to emergency room for further evaluation considering she has multiple symptoms related to hypothyroidism and uncontrolled TSH level  - Please note that we have reviewed the lipid panel. LDL is 116, slightly above the goal of less than 100. Patient will go to emergency room for further evaluation. Once TSH normalizes then we will recheck lipid panel and add anti-hyperlipidemics as needed. 08/11/2012

## 2012-09-01 NOTE — ED Provider Notes (Signed)
History     CSN: [id number]  Arrival date & time 09/01/12  1107   First MD Initiated Contact with Patient 09/01/12 1118      Chief Complaint  Patient presents with  . Thyroid Problem  . Back Pain  . Numbness    (Consider location/radiation/quality/duration/timing/severity/associated sxs/prior treatment) HPI Comments: Patient is a 42 year old female with a past medical history of hypothyroidism who presents with 2 month history of multiple complaints. Symptoms started gradually and progressively worsened since the onset. Patient reports intermittent low back pain, arm and leg soreness, foot and leg cramps, hand numbness after holding objects for extended periods of time, sensitive legs with intermittent numbness, knee and feet swelling, forgetfulness, can't remember faces, and trouble speaking. Patient was seen at her PCP office this morning and was recommended to come to the ED for further evaluation. Dr. Elisabeth Pigeon is concerned the patient is going into a myxedema coma. No aggravating/alleviating factors.    Past Medical History  Diagnosis Date  . Asthma   . Hypothyroid   . Sleep apnea     History reviewed. No pertinent past surgical history.  History reviewed. No pertinent family history.  History  Substance Use Topics  . Smoking status: Never Smoker   . Smokeless tobacco: Not on file  . Alcohol Use: No    OB History   Grav Para Term Preterm Abortions TAB SAB Ect Mult Living                  Review of Systems  Musculoskeletal: Positive for joint swelling.  Neurological: Positive for speech difficulty, weakness and numbness.  All other systems reviewed and are negative.    Allergies  Review of patient's allergies indicates no known allergies.  Home Medications   Current Outpatient Rx  Name  Route  Sig  Dispense  Refill  . ibuprofen (ADVIL,MOTRIN) 200 MG tablet   Oral   Take 200-400 mg by mouth every 6 (six) hours as needed for pain.         Marland Kitchen levothyroxine  (SYNTHROID, LEVOTHROID) 125 MCG tablet   Oral   Take 125 mcg by mouth daily before breakfast.           BP 116/92  Pulse 86  Temp(Src) 97.7 F (36.5 C) (Oral)  Resp 16  SpO2 94%  Physical Exam  Nursing note and vitals reviewed. Constitutional: She is oriented to person, place, and time. She appears well-developed and well-nourished. No distress.  HENT:  Head: Normocephalic and atraumatic.  Eyes: Conjunctivae and EOM are normal. Pupils are equal, round, and reactive to light.  Neck: Normal range of motion.  Cardiovascular: Normal rate and regular rhythm.  Exam reveals no gallop and no friction rub.   No murmur heard. Pulmonary/Chest: Effort normal and breath sounds normal. She has no wheezes. She has no rales. She exhibits no tenderness.  Abdominal: Soft. There is no tenderness.  Musculoskeletal: Normal range of motion.  Neurological: She is alert and oriented to person, place, and time. Coordination normal.  Speech is slow. Moves limbs without ataxia.   Skin: Skin is warm and dry.  Psychiatric: She has a normal mood and affect. Her behavior is normal.    ED Course  Procedures (including critical care time)  Labs Reviewed  CBC WITH DIFFERENTIAL  BASIC METABOLIC PANEL   No results found.   1. Hypothyroidism       MDM  2:01 PM Labs pending. I spoke with Dr. Elisabeth Pigeon from the [facility] who is concerned the patient is going into a myxedema coma. Patient will be admitted for monitoring and further evaluation.   3:29 PM Labs unremarkable. Patient will be admitted by Dr. Brien Few for further evaluation. He will consult endocrine for the patient.       Emilia Beck, PA-C 09/01/12 [phone]

## 2012-09-01 NOTE — H&P (Signed)
PCP:   Standley Dakins, MD   Chief Complaint:  Paresthesiae, in hands and feet, cramps, memory difficulties and fatigue.   HPI: This is a 42 year old female, with known history of bronchial asthma, hypothyroidism, OSA diagnosed via PSG several years ago, not using CPAP, after her machine broke down in 2008, referred to the ED on 09/01/12, by Dr Manson Passey for further evaluation, after she presented at the [facility], with complaints of fatigue, soreness/cramps her extremities, tingling sensation/numbness in hands and feet, over the past couple of months. As a matter of fact, she occasionally drops objects, due to "numbness", although simply because she falls asleep while holding them. She has noticed swelling in the knees and and ankles of both feet over the past few weeks, with some soreness, but no difficulty in ambulation. For the past several years, she has had trouble remembering faces and names of people she met and worked with in recent past, and remembering certain words. The same goes for problems with speech and getting words and thought out. Per patient, she ran out of Synthroid for about 2 months, after [facility] closed, then was given a prescription for Synthroid 125 mcg daily, at the [facility] on the 08/11/12, and has been compliant with her medication since then. TSH on 08/11/12 was 135.221.     Allergies:  No Known Allergies    Past Medical History  Diagnosis Date  . Asthma   . Hypothyroid   . Sleep apnea     Past Surgical History  Procedure Laterality Date  . Dental surgery      Prior to Admission medications   Medication Sig Start Date End Date Taking? Authorizing Provider  ibuprofen (ADVIL,MOTRIN) 200 MG tablet Take 200-400 mg by mouth every 6 (six) hours as needed for pain.   Yes Historical Provider, MD  levothyroxine (SYNTHROID, LEVOTHROID) 125 MCG tablet Take 125 mcg by mouth daily before breakfast.   Yes Historical Provider, MD    Social  History: Patient reports that she has never smoked. She does not have any smokeless tobacco history on file. She reports that she does not drink alcohol or use illicit drugs.  Family History: Father is aged 53 years, with no medical problems. Mother is aged 76 years with DM-2 and HTN.  Review of Systems:  As per HPI and chief complaint. Patent denies fatigue, diminished appetite, weight loss, fever, chills, headache, blurred vision, difficulty in speaking, dysphagia, chest pain, cough, shortness of breath, orthopnea, paroxysmal nocturnal dyspnea, nausea, diaphoresis, abdominal pain, vomiting, diarrhea, belching, heartburn, hematemesis, melena, dysuria, nocturia, urinary frequency, hematochezia, lower extremity pain, or redness. The rest of the systems review is negative.  Physical Exam:  General:  Patient does not appear to be in obvious acute distress. Alert, communicative, fully oriented, talking in complete sentences, not short of breath at rest.  HEENT:  No clinical pallor, no jaundice, no conjunctival injection or discharge. NECK:  Supple, JVP not seen, no carotid bruits, no palpable lymphadenopathy, no palpable goiter. CHEST:  Clinically clear to auscultation, no wheezes, no crackles. HEART:  Sounds 1 and 2 heard, normal, regular, no murmurs. ABDOMEN:  Morbidly obese, soft, non-tender, no palpable organomegaly, no palpable masses, normal bowel sounds. GENITALIA:  Not examined. LOWER EXTREMITIES:  Moderate pitting edema, palpable peripheral pulses. MUSCULOSKELETAL SYSTEM:  Unremarkable. CENTRAL NERVOUS SYSTEM:  No focal neurologic deficit on gross examination.  Labs on Admission:  Results for orders placed during the hospital encounter of 09/01/12 (from the past 48 hour(s))  CBC WITH DIFFERENTIAL     Status: None   Collection Time    09/01/12  2:15 PM      Result Value Range   WBC 7.7  4.0 - 10.5 K/uL   RBC 4.54  3.87 - 5.11 MIL/uL   Hemoglobin 13.1  12.0 - 15.0 g/dL   HCT 16.1   09.6 - 04.5 %   MCV 86.3  78.0 - 100.0 fL   MCH 28.9  26.0 - 34.0 pg   MCHC 33.4  30.0 - 36.0 g/dL   RDW 40.9  81.1 - 91.4 %   Platelets 260  150 - 400 K/uL   Neutrophils Relative % 65  43 - 77 %   Neutro Abs 5.0  1.7 - 7.7 K/uL   Lymphocytes Relative 23  12 - 46 %   Lymphs Abs 1.8  0.7 - 4.0 K/uL   Monocytes Relative 8  3 - 12 %   Monocytes Absolute 0.6  0.1 - 1.0 K/uL   Eosinophils Relative 4  0 - 5 %   Eosinophils Absolute 0.3  0.0 - 0.7 K/uL   Basophils Relative 0  0 - 1 %   Basophils Absolute 0.0  0.0 - 0.1 K/uL  BASIC METABOLIC PANEL     Status: None   Collection Time    09/01/12  2:15 PM      Result Value Range   Sodium 137  135 - 145 mEq/L   Potassium 4.0  3.5 - 5.1 mEq/L   Chloride 102  96 - 112 mEq/L   CO2 25  19 - 32 mEq/L   Glucose, Bld 83  70 - 99 mg/dL   BUN 10  6 - 23 mg/dL   Creatinine, Ser 7.82  0.50 - 1.10 mg/dL   Calcium 8.9  8.4 - 95.6 mg/dL   GFR calc non Af Amer >90  >90 mL/min   GFR calc Af Amer >90  >90 mL/min   Comment:            The eGFR has been calculated     using the CKD EPI equation.     This calculation has not been     validated in all clinical     situations.     eGFR's persistently     <90 mL/min signify     possible Chronic Kidney Disease.    Radiological Exams on Admission: No results found.  Assessment/Plan Active Problems:    1. Hypothyroidism: Patient has known hypothyroidism, and was documented on 08/11/12, to have a TSH of 135. 221, due to a 21-month period of non-compliance. Per patient, she has been compliant with her medication, since restarting Synthroid on 08/11/12. She has non-specific symptoms of fatigueability and tingling paresthesiae, so she still might be dysthyroid. We shall re-check TSH, but continue current dose of Synthroid for now. Some adjustment may be needed. Patient is neither constipated or bradycardic. In addition, we shall check ACTH and urinary free cortisol.  2. OSA (obstructive sleep apnea): This was  diagnosed several years ago, and per patient and her parent, who were present in the ED, confirmed by PSG. She has been non-compliant with CPAP however, since 2008, as her machine broke down. Untreated OSA is no doubt contributory for at least some of her symptoms of fatigue, somnolence and dropping objects. We shall commence nocturnal CPAP.  3. Edema: Patient has at least moderate bilateral LE edema. 2D Echocardiogram is in order, but for completeness, will check albumin levels.  4. Memory difficulty: This appears to be a lon-standing problem. Will evaluate with brain MRI.  5. Query Asthma: Patient does not appear certain of this diagnosis, and certainly, was not on bronchodilators pre-admission. Chest auscultation is devoid of wheeze or rhonchi. Will observe for now.   Further management will depend on clinical course.   Comment: Patient is FULL CODE.     Time Spent on Admission: 45 mins.   Akeira Lahm,CHRISTOPHER 09/01/2012, 4:21 PM

## 2012-09-01 NOTE — Progress Notes (Signed)
[facility] has seen patient. Pt does have a [employer] through [employer]. Pt stated that they are the ones who told her to come to the hospital.

## 2012-09-02 DIAGNOSIS — I519 Heart disease, unspecified: Secondary | ICD-10-CM

## 2012-09-02 LAB — CBC
MCV: 87 fL (ref 78.0–100.0)
Platelets: 247 10*3/uL (ref 150–400)
RBC: 4.37 MIL/uL (ref 3.87–5.11)
WBC: 6.4 10*3/uL (ref 4.0–10.5)

## 2012-09-02 LAB — BASIC METABOLIC PANEL
CO2: 27 mEq/L (ref 19–32)
Calcium: 8.5 mg/dL (ref 8.4–10.5)
Chloride: 103 mEq/L (ref 96–112)
Sodium: 136 mEq/L (ref 135–145)

## 2012-09-02 LAB — ACTH: C206 ACTH: 13 pg/mL (ref 10–46)

## 2012-09-02 LAB — VITAMIN B12: Vitamin B-12: 340 pg/mL (ref 211–911)

## 2012-09-02 NOTE — Progress Notes (Signed)
*  PRELIMINARY RESULTS* Echocardiogram 2D Echocardiogram has been performed.  Kristi Ortiz 09/02/2012, 2:41 PM

## 2012-09-02 NOTE — Progress Notes (Addendum)
Was asked again to see pt about getting a CPAP machine. 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 She stated she does not want to get one until they have taken care of the roach problem and that they are working on it. She is wanting prices of wedge pillows from [facility] which I have been told range from $54.99-$69.99. To replace the CPAP machine with a refurbished one is $100.00 and she needs to call [phone] to get the process started. Pt informed of all this. Dr Brien Few made aware as well.  Algernon Huxley RN BSN  [phone]

## 2012-09-02 NOTE — Progress Notes (Signed)
[facility] PROGRESS NOTE  Kristi Ortiz JWJ:[id number] DOB: [birth year] DOA: 09/01/2012 PCP: Standley Dakins, MD  Assessment/Plan: Active Problems:   Hypothyroidism   OSA (obstructive sleep apnea)   Memory difficulty   Asthma   Edema    1. Hypothyroidism: Patient has known hypothyroidism, and was documented on 08/11/12, to have a TSH of 135. 221, due to a 69-month period of non-compliance. Per patient, she has been compliant with her medication, since restarting Synthroid on 08/11/12. She has non-specific symptoms of fatigueability and tingling paresthesiae. TSH is now 5.908, so we shall continue dose of Synthroid for now. Some adjustment may be needed, but this can be addressed on follow up with her PMD. Recommend repeat TSH after 2-3 weeks. Patient is neither constipated or bradycardic. ACTH and urinary free cortisol are pending.  2. OSA (obstructive sleep apnea): This was diagnosed several years ago, and per patient and her parent, who were present in the ED, confirmed by PSG. She has been non-compliant with CPAP however, since 2008, as her machine broke down. Untreated OSA is no doubt contributory for at least some of her symptoms of fatigue, somnolence and dropping objects. Commenced on nocturnal CPAP, which she tolerated. Efforts are in progress to assist patient obtain a CPAP machine.  3. Edema: Patient has at least moderate bilateral LE edema. 2D Echocardiogram is pending. Albumin levels is reasonable at 3.7.  4. Memory difficulty: This appears to be a log-standing problem. Brain MRI is negative. Patient has been reassured accordingly. Will obtain vit B12/Folate levels.  5. Query Asthma: Patient does not appear certain of this diagnosis, and certainly, was not on bronchodilators pre-admission. Chest auscultation is devoid of wheeze or rhonchi. Will observe for now. She currently has no respiratory tract symptoms.    Code Status: Full Code.  Family Communication: Family fully  updated at bedside.  Disposition Plan: Aiming possible DC on 09/03/12.    Brief narrative: This is a 42 year old female, with known history of bronchial asthma, hypothyroidism, OSA diagnosed via PSG several years ago, not using CPAP, after her machine broke down in 2008, referred to the ED on 09/01/12, by Dr Manson Passey for further evaluation, after she presented at the [facility], with complaints of fatigue, soreness/cramps her extremities, tingling sensation/numbness in hands and feet, over the past couple of months. As a matter of fact, she occasionally drops objects, due to "numbness", although simply because she falls asleep while holding them. She has noticed swelling in the knees and and ankles of both feet over the past few weeks, with some soreness, but no difficulty in ambulation. For the past several years, she has had trouble remembering faces and names of people she met and worked with in recent past, and remembering certain words. The same goes for problems with speech and getting words and thought out. Per patient, she ran out of Synthroid for about 2 months, after [facility] closed, then was given a prescription for Synthroid 125 mcg daily, at the [facility] on the 08/11/12, and has been compliant with her medication since then. TSH on 08/11/12 was 135.221.    Consultants:  N/A.   Procedures:  Brain MRI.   Antibiotics: N/A.  HPI/Subjective: No new issues.   Objective: Vital signs in last 24 hours: Temp:  [97.8 F (36.6 C)-98.5 F (36.9 C)] 98.5 F (36.9 C) (06/19 2200) Pulse Rate:  [81-88] 81 (06/19 2200) Resp:  [20] 20 (06/19 2200) BP: (112-134)/(78-86) 112/78 mmHg (06/19 2200) SpO2:  [92 %-98 %]  92 % (06/19 2200) Weight:  [54.296 kg (119 lb 11.2 oz)-120.2 kg (264 lb 15.9 oz)] 120.2 kg (264 lb 15.9 oz) (06/20 1148) Weight change:  Last BM Date: 09/01/12  Intake/Output from previous day: 06/19 0701 - 06/20 0700 In: 240 [P.O.:240] Out: 1 [Urine:1] Total  I/O In: 240 [P.O.:240] Out: 2 [Urine:1; Stool:1]   Physical Exam: General: Patient does not appear to be in obvious acute distress. Alert, communicative, fully oriented, talking in complete sentences, not short of breath at rest.  HEENT: No clinical pallor, no jaundice, no conjunctival injection or discharge.  NECK: Supple, JVP not seen, no carotid bruits, no palpable lymphadenopathy, no palpable goiter.  CHEST: Clinically clear to auscultation, no wheezes, no crackles.  HEART: Sounds 1 and 2 heard, normal, regular, no murmurs.  ABDOMEN: Morbidly obese, soft, non-tender, no palpable organomegaly, no palpable masses, normal bowel sounds.  GENITALIA: Not examined.  LOWER EXTREMITIES: Moderate pitting edema, palpable peripheral pulses.  MUSCULOSKELETAL SYSTEM: Unremarkable.  CENTRAL NERVOUS SYSTEM: No focal neurologic deficit on gross examination.   Lab Results:  Recent Labs  09/01/12 1710 09/02/12 0559  WBC 8.3 6.4  HGB 13.1 12.4  HCT 39.0 38.0  PLT 261 247    Recent Labs  09/01/12 1415 09/01/12 1710 09/02/12 0559  NA 137  --  136  K 4.0  --  3.8  CL 102  --  103  CO2 25  --  27  GLUCOSE 83  --  99  BUN 10  --  10  CREATININE 0.54 0.61 0.58  CALCIUM 8.9  --  8.5   No results found for this or any previous visit (from the past 240 hour(s)).   Studies/Results: Mr Sherrin Daisy Contrast  09/02/2012   *RADIOLOGY REPORT*  Clinical Data: Memory difficulties. Tingling sensation in hands.  MRI HEAD WITHOUT CONTRAST  Technique:  Multiplanar, multiecho pulse sequences of the brain and surrounding structures were obtained according to standard protocol without intravenous contrast.  Comparison: None.  Findings:   No acute stroke or hemorrhage.  No mass lesion or hydrocephalus.  No extra-axial fluid.  Normal cerebral volume.  No white matter disease.  No foci of chronic hemorrhage.  Flow voids are maintained in the major intracranial vessels.   No signs of venous sinus thrombosis.   Normal calvarium and skull base. Upper cervical region unremarkable.  Normal pituitary.  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  Mild right inferior turbinate hypertrophy.  Hypoplastic right maxillary sinus.  No acute sinus disease.  Negative orbits.  No mastoid fluid.  IMPRESSION: Unremarkable cranial MRI.  No acute or focal intracranial findings. No generalized or focal cerebral volume loss.  No evidence for white matter disease or chronic hemorrhage.   Original Report Authenticated By: Davonna Belling, M.D.    Medications: Scheduled Meds: . heparin  5,000 Units Subcutaneous Q8H  . levothyroxine  125 mcg Oral QAC breakfast  . sodium chloride  3 mL Intravenous Q12H   Continuous Infusions:  PRN Meds:.sodium chloride, acetaminophen, acetaminophen, albuterol, morphine injection, ondansetron (ZOFRAN) IV, ondansetron, oxyCODONE, sodium chloride    LOS: 1 day   Arvid Marengo,CHRISTOPHER  [facility] Pager [phone]. If 8PM-8AM, please contact night-coverage at [email or web address], password [email or web address] 09/02/2012, 2:28 PM  LOS: 1 day

## 2012-09-02 NOTE — ED Provider Notes (Signed)
Medical screening examination/treatment/procedure(s) were conducted as a shared visit with non-physician practitioner(s) and myself.  I personally evaluated the patient during the encounter.  Pt with hypothyroidism, symptomatic. Concerns for impending myxedema per PCP - so reasonable to admit. Also concerns for adrenal insufficiency.   Derwood Kaplan, MD 09/02/12 1458

## 2012-09-02 NOTE — Discharge Summary (Signed)
Physician Discharge Summary  Karlie Aung RUE:[id number] DOB: Jan 18, 1971 DOA: 09/01/2012  PCP: Standley Dakins, MD  Admit date: 09/01/2012 Discharge date: 09/03/2012  Time spent: 40 minutes  Recommendations for Outpatient Follow-up:  1. Follow up with primary MD.  2. PMD recommended to repeat TSH after 2-3 weeks, and will follow up on pending labs, ie urinary free cortisol and RBC folate.   Discharge Diagnoses:  Active Problems:   Hypothyroidism   OSA (obstructive sleep apnea)   Memory difficulty   Asthma   Edema   Discharge Condition: Satisfactory.   Diet recommendation: Regular.   Filed Weights   09/01/12 1700 09/02/12 1148  Weight: 54.296 kg (119 lb 11.2 oz) 120.2 kg (264 lb 15.9 oz)    History of present illness:  This is a 42 year old female, with known history of bronchial asthma, hypothyroidism, OSA diagnosed via PSG several years ago, not using CPAP, after her machine broke down in 2008, referred to the ED on 09/01/12, by Dr Manson Passey for further evaluation, after she presented at the [facility], with complaints of fatigue, soreness/cramps her extremities, tingling sensation/numbness in hands and feet, over the past couple of months. As a matter of fact, she occasionally drops objects, due to "numbness", although simply because she falls asleep while holding them. She has noticed swelling in the knees and and ankles of both feet over the past few weeks, with some soreness, but no difficulty in ambulation. For the past several years, she has had trouble remembering faces and names of people she met and worked with in recent past, and remembering certain words. The same goes for problems with speech and getting words and thought out. Per patient, she ran out of Synthroid for about 2 months, after [facility] closed, then was given a prescription for Synthroid 125 mcg daily, at the [facility] on the 08/11/12, and has been compliant with her medication since then. TSH on  08/11/12 was 135.221.    Hospital Course:  1. Hypothyroidism: Patient has known hypothyroidism, and was documented on 08/11/12, to have a TSH of 135. 221, due to a 70-month period of non-compliance. Per patient, she has been compliant with her medication, since restarting Synthroid on 08/11/12. She has non-specific symptoms of fatigueability and tingling paresthesiae. TSH is now 5.908, so we have continued current dose of Synthroid for now. Some slight adjustment may be needed, but this can be addressed on follow up with her PMD. Recommend repeat TSH after 2-3 weeks. Patient is neither constipated or bradycardic. ACTH was normal at 13, and 24-hour urinary free cortisol were done, but results are pending.  2. OSA (obstructive sleep apnea): This was diagnosed several years ago, and per patient and her parents, who were present in the ED, confirmed by PSG. She has been non-compliant with CPAP however, since 2008, as her machine broke down. Untreated OSA is no doubt contributory for at least some of her symptoms of fatigue, somnolence and dropping objects. Commenced on nocturnal CPAP, which she tolerated. Vigorous efforts were made by care manager to assist patient obtain a CPAP machine, but patient declined, because of what she saw as a roach problem at her parent's home, where she currently resides. She prefers to use a [employer], which is of course, not as efficacious. I had indicated as much to her, but her mind is made up.  3. Edema: Patient has at least moderate bilateral LE edema. Albumin level is reasonable at 3.7. 2D echocardiogram revealed normal LV cavity size,  EF of 55% to 60%. Regional wall motion abnormalities cannot be excluded. Grade 1 diastolic dysfunction. LA was mildly dilated. 4. Memory difficulty: This appears to be a log-standing problem. Brain MRI is negative. Patient has been reassured accordingly. Vit B12 is normal at 340, RBC folate is pending.  5. Query Asthma: Patient does not appear  certain of this diagnosis, and certainly, was not on bronchodilators pre-admission. Chest auscultation was devoid of wheeze or rhonchi, during this hospitalization. She currently has no respiratory tract symptoms.    Procedures:  See Below.  2D Echocardiogram.   Consultations:  N/A.   Discharge Exam: Filed Vitals:   09/02/12 1148 09/02/12 1445 09/02/12 2200 09/03/12 0600  BP:  104/70 117/78 115/76  Pulse:  84 83 89  Temp:  98.2 F (36.8 C) 98 F (36.7 C) 98.1 F (36.7 C)  TempSrc:  Oral Oral Oral  Resp:  20 20 20   Height:      Weight: 120.2 kg (264 lb 15.9 oz)     SpO2:  94% 95% 98%    General: Patient does not appear to be in obvious acute distress. Alert, communicative, fully oriented, talking in complete sentences, not short of breath at rest.  HEENT: No clinical pallor, no jaundice, no conjunctival injection or discharge.  NECK: Supple, JVP not seen, no carotid bruits, no palpable lymphadenopathy, no palpable goiter.  CHEST: Clinically clear to auscultation, no wheezes, no crackles.  HEART: Sounds 1 and 2 heard, normal, regular, no murmurs.  ABDOMEN: Morbidly obese, soft, non-tender, no palpable organomegaly, no palpable masses, normal bowel sounds.  GENITALIA: Not examined.  LOWER EXTREMITIES: Moderate pitting edema, palpable peripheral pulses.  MUSCULOSKELETAL SYSTEM: Unremarkable.  CENTRAL NERVOUS SYSTEM: No focal neurologic deficit on gross examination.  Discharge Instructions      Discharge Orders   Future Orders Complete By Expires     Diet general  As directed     Increase activity slowly  As directed         Medication List    TAKE these medications       ibuprofen 200 MG tablet  Commonly known as:  ADVIL,MOTRIN  Take 200-400 mg by mouth every 6 (six) hours as needed for pain.     levothyroxine 125 MCG tablet  Commonly known as:  SYNTHROID, LEVOTHROID  Take 125 mcg by mouth daily before breakfast.       No Known Allergies Follow-up  Information   Call Standley Dakins, MD.   Contact information:   [address] [place] [address] [phone]        The results of significant diagnostics from this hospitalization (including imaging, microbiology, ancillary and laboratory) are listed below for reference.    Significant Diagnostic Studies: Mr Sherrin Daisy Contrast  09/02/2012   *RADIOLOGY REPORT*  Clinical Data: Memory difficulties. Tingling sensation in hands.  MRI HEAD WITHOUT CONTRAST  Technique:  Multiplanar, multiecho pulse sequences of the brain and surrounding structures were obtained according to standard protocol without intravenous contrast.  Comparison: None.  Findings:   No acute stroke or hemorrhage.  No mass lesion or hydrocephalus.  No extra-axial fluid.  Normal cerebral volume.  No white matter disease.  No foci of chronic hemorrhage.  Flow voids are maintained in the major intracranial vessels.   No signs of venous sinus thrombosis.  Normal calvarium and skull base. Upper cervical region unremarkable.  Normal pituitary.  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  Mild right inferior turbinate hypertrophy.  Hypoplastic  right maxillary sinus.  No acute sinus disease.  Negative orbits.  No mastoid fluid.  IMPRESSION: Unremarkable cranial MRI.  No acute or focal intracranial findings. No generalized or focal cerebral volume loss.  No evidence for white matter disease or chronic hemorrhage.   Original Report Authenticated By: Davonna Belling, M.D.    Microbiology: No results found for this or any previous visit (from the past 240 hour(s)).   Labs: Basic Metabolic Panel:  Recent Labs Lab 09/01/12 1415 09/01/12 1710 09/02/12 0559  NA 137  --  136  K 4.0  --  3.8  CL 102  --  103  CO2 25  --  27  GLUCOSE 83  --  99  BUN 10  --  10  CREATININE 0.54 0.61 0.58  CALCIUM 8.9  --  8.5   Liver Function Tests:  Recent Labs Lab 09/01/12 1710  ALBUMIN 3.7   No results found for  this basename: LIPASE, AMYLASE,  in the last 168 hours No results found for this basename: AMMONIA,  in the last 168 hours CBC:  Recent Labs Lab 09/01/12 1415 09/01/12 1710 09/02/12 0559  WBC 7.7 8.3 6.4  NEUTROABS 5.0  --   --   HGB 13.1 13.1 12.4  HCT 39.2 39.0 38.0  MCV 86.3 86.1 87.0  PLT 260 261 247   Cardiac Enzymes: No results found for this basename: CKTOTAL, CKMB, CKMBINDEX, TROPONINI,  in the last 168 hours BNP: BNP (last 3 results) No results found for this basename: PROBNP,  in the last 8760 hours CBG: No results found for this basename: GLUCAP,  in the last 168 hours     Signed:  Naydeen Speirs,CHRISTOPHER  [facility] 09/03/2012, 12:40 PM

## 2012-09-02 NOTE — Progress Notes (Signed)
Consult received to assist pt is obtaining new CPAP machine. I spoke with pt who informed me she received her last one from [facility] and that it had been destroyed after insects got into it at her parents house.  I spoke with [place] with [facility] who stated she would look in their system and also apeak with pt.   Mayra Reel spoke with pt and then called me to inform me that pt did not want to get a new CPAP machine but instead would like to get a wedge pillow. Pt is aware on where to get a wedge pillow.  Algernon Huxley RN BSN   [phone]

## 2012-09-03 NOTE — Progress Notes (Signed)
Patient discharged home in stable condition. No change from morning assessment.  Instructions given to patient and parents with verbal understanding and feedback.  No further questions or concerns at this time.

## 2012-09-09 LAB — CORTISOL, URINE, FREE
Cortisol (Ur), Free: 30.5 mcg/24 h (ref 4.0–50.0)
Creatinine, Urine mg/day-CORTUR: 1.37 g/(24.h) (ref 0.63–2.50)

## 2012-09-13 LAB — MISCELLANEOUS TEST

## 2012-09-15 ENCOUNTER — Ambulatory Visit: Payer: No Typology Code available for payment source | Attending: Family Medicine | Admitting: Family Medicine

## 2012-09-15 VITALS — BP 116/82 | HR 90 | Temp 97.9°F | Resp 18 | Wt 265.0 lb

## 2012-09-15 DIAGNOSIS — G4733 Obstructive sleep apnea (adult) (pediatric): Secondary | ICD-10-CM

## 2012-09-15 DIAGNOSIS — E039 Hypothyroidism, unspecified: Secondary | ICD-10-CM

## 2012-09-15 DIAGNOSIS — J45909 Unspecified asthma, uncomplicated: Secondary | ICD-10-CM

## 2012-09-15 DIAGNOSIS — R609 Edema, unspecified: Secondary | ICD-10-CM

## 2012-09-15 DIAGNOSIS — R413 Other amnesia: Secondary | ICD-10-CM

## 2012-09-15 DIAGNOSIS — G4730 Sleep apnea, unspecified: Secondary | ICD-10-CM | POA: Insufficient documentation

## 2012-09-15 DIAGNOSIS — J4520 Mild intermittent asthma, uncomplicated: Secondary | ICD-10-CM

## 2012-09-15 DIAGNOSIS — E785 Hyperlipidemia, unspecified: Secondary | ICD-10-CM

## 2012-09-15 NOTE — Progress Notes (Signed)
Patient ID: Kristi Ortiz, female   DOB: [birth year], 42 y.o.   MRN: [id number]  CC: follow up hospital   HPI: Pt says that she feels better.  She is taking her thyroid medication.  She is reporting that she is still not willing to try CPAP.  Pt is adamant.  Pt says that she is willing to consider alternatives.  No other complaints.  Not following a low cholesterol diet.   No Known Allergies Past Medical History  Diagnosis Date  . Asthma   . Hypothyroid   . Sleep apnea    Current Outpatient Prescriptions on File Prior to Visit  Medication Sig Dispense Refill  . ibuprofen (ADVIL,MOTRIN) 200 MG tablet Take 200-400 mg by mouth every 6 (six) hours as needed for pain.      Marland Kitchen levothyroxine (SYNTHROID, LEVOTHROID) 125 MCG tablet Take 125 mcg by mouth daily before breakfast.       No current facility-administered medications on file prior to visit.   Family History  Problem Relation Age of Onset  . Diabetes Mother    History   Social History  . Marital Status: Single    Spouse Name: N/A    Number of Children: N/A  . Years of Education: N/A   Occupational History  . Not on file.   Social History Main Topics  . Smoking status: Never Smoker   . Smokeless tobacco: Never Used  . Alcohol Use: No  . Drug Use: No  . Sexually Active: Not on file   Other Topics Concern  . Not on file   Social History Narrative  . No narrative on file    Review of Systems  Constitutional: Negative for fever, chills, diaphoresis, activity change, appetite change and fatigue.  HENT: Negative for ear pain, nosebleeds, congestion, facial swelling, rhinorrhea, neck pain, neck stiffness and ear discharge.   Eyes: Negative for pain, discharge, redness, itching and visual disturbance.  Respiratory: Negative for cough, choking, chest tightness, shortness of breath, wheezing and stridor.   Cardiovascular: Negative for chest pain, palpitations and leg swelling.  Gastrointestinal: Negative for abdominal  distention.  Genitourinary: Negative for dysuria, urgency, frequency, hematuria, flank pain, decreased urine volume, difficulty urinating and dyspareunia.  Musculoskeletal: Negative for back pain, joint swelling, arthralgias and gait problem.  Neurological: Negative for dizziness, tremors, seizures, syncope, facial asymmetry, speech difficulty, weakness, light-headedness, numbness and headaches.  Hematological: Negative for adenopathy. Does not bruise/bleed easily.  Psychiatric/Behavioral: Negative for hallucinations, behavioral problems, confusion, dysphoric mood, decreased concentration and agitation.    Objective:   Filed Vitals:   09/15/12 1342  BP: 116/82  Pulse: 90  Temp: 97.9 F (36.6 C)  Resp: 18    Physical Exam  Constitutional: Appears well-developed and well-nourished. No distress.  HENT: Normocephalic. External right and left ear normal. Oropharynx is clear and moist.  Eyes: Conjunctivae and EOM are normal. PERRLA, no scleral icterus.  Neck: Normal ROM. Neck supple. No JVD. No tracheal deviation. mild thyromegaly.  CVS: RRR, S1/S2 +, no murmurs, no gallops, no carotid bruit.  Pulmonary: Effort and breath sounds normal, no stridor, rhonchi, wheezes, rales.  Abdominal: Soft. BS +,  no distension, tenderness, rebound or guarding.  Musculoskeletal: Normal range of motion. No edema and no tenderness.  Lymphadenopathy: No lymphadenopathy noted, cervical, inguinal. Neuro: Alert. Normal reflexes, muscle tone coordination. No cranial nerve deficit. Skin: Skin is warm and dry. No rash noted. Not diaphoretic. No erythema. No pallor.  Psychiatric: Normal mood and affect. Behavior, judgment, thought content normal.  Lab Results  Component Value Date   WBC 6.4 09/02/2012   HGB 12.4 09/02/2012   HCT 38.0 09/02/2012   MCV 87.0 09/02/2012   PLT 247 09/02/2012   Lab Results  Component Value Date   CREATININE 0.58 09/02/2012   BUN 10 09/02/2012   NA 136 09/02/2012   K 3.8 09/02/2012    CL 103 09/02/2012   CO2 27 09/02/2012    No results found for this basename: HGBA1C   Lipid Panel     Component Value Date/Time   CHOL 189 09/21/2007 2042   TRIG 145 09/21/2007 2042   HDL 44 09/21/2007 2042   CHOLHDL 4.3 Ratio 09/21/2007 2042   VLDL 29 09/21/2007 2042   LDLCALC 116* 09/21/2007 2042       Assessment and plan:   Patient Active Problem List   Diagnosis Date Noted  . Hypothyroidism 09/01/2012  . OSA (obstructive sleep apnea) 09/01/2012  . Memory difficulty 09/01/2012  . Asthma 09/01/2012  . Edema 09/01/2012  . Unspecified hypothyroidism 08/11/2012   Sleep Apnea Pt still refusing to use sleep apnea Pt is interested in getting a wedge pillow as an alternative to CPAP  Hypothyroidism - continue thyroid hormone supplementation - check TSH level today  Dyslipidemia  - check lipids when her TSH has been treated and returned to normal -dyslipidemia  Follow up in 2 months  The patient was given clear instructions to go to ER or return to medical center if symptoms don't improve, worsen or new problems develop.  The patient verbalized understanding.  The patient was told to call to get any lab results if not heard anything in the next week.    Rodney Langton, MD, CDE, FAAFP [facility] [place], [place]

## 2012-09-15 NOTE — Progress Notes (Signed)
Patient here for follow up Hypothyroidism Sleep apnea

## 2012-09-15 NOTE — Patient Instructions (Signed)

## 2012-09-16 LAB — LIPID PANEL
HDL: 38 mg/dL — ABNORMAL LOW (ref >39)
LDL Cholesterol: 116 mg/dL — ABNORMAL HIGH (ref 0–99)
Total CHOL/HDL Ratio: 5.2 Ratio
Triglycerides: 218 mg/dL — ABNORMAL HIGH (ref <150)
VLDL: 44 mg/dL — ABNORMAL HIGH (ref 0–40)

## 2012-09-19 NOTE — Progress Notes (Signed)
Quick Note:  Please inform patient that her TSH level has come back down into normal range. Also her cholesterol is elevated but I'm recommending a low-fat low-cholesterol diet and exercise and rechecking cholesterol levels in 3-4 months.  Rodney Langton, MD, CDE, FAAFP [facility] [place], [place]   ______

## 2012-09-22 ENCOUNTER — Telehealth: Payer: Self-pay

## 2012-09-22 NOTE — Telephone Encounter (Signed)
Message copied by Lestine Mount on Thu Sep 22, 2012 12:42 PM ------      Message from: Cleora Fleet      Created: Mon Sep 19, 2012 10:20 AM       Please inform patient that her TSH level has come back down into normal range.  Also her cholesterol is elevated but I'm recommending a low-fat low-cholesterol diet and exercise and rechecking cholesterol levels in 3-4 months.            Rodney Langton, MD, CDE, FAAFP      [facility]      [facility]      [place], [place]        ------

## 2012-09-22 NOTE — Telephone Encounter (Signed)
Patient is aware of her lab results 

## 2012-10-18 ENCOUNTER — Telehealth: Payer: Self-pay | Admitting: Family Medicine

## 2012-10-18 MED ORDER — LEVOTHYROXINE SODIUM 125 MCG PO TABS: 125.0000 ug | ORAL_TABLET | Freq: Every day | ORAL | Status: DC

## 2012-10-18 NOTE — Telephone Encounter (Signed)
Please inform patient that prescription was sent to [facility] Bronwen Betters, MD, CDE, FAAFP [facility] [place], [place]

## 2012-10-18 NOTE — Telephone Encounter (Signed)
Pt has 2 weeks left of script for levothyroxine; pt would like to get Dr. To renew script to carry her through until her oct. FU; [facility] @ [facility]

## 2012-11-25 ENCOUNTER — Telehealth: Payer: Self-pay | Admitting: Family Medicine

## 2012-11-25 NOTE — Telephone Encounter (Addendum)
Pt is scheduled for a physical on 12/15/12 but she has an update since her last visit.  As of a couple a weeks when she stretches her leg her left hamstring hurts.  She says she has been propping foot up and has been using ice but is wondering if she can do anything else especially because she has also been experiencing swelling in both feet.

## 2012-12-08 ENCOUNTER — Encounter: Payer: No Typology Code available for payment source | Admitting: Family Medicine

## 2012-12-09 ENCOUNTER — Telehealth: Payer: Self-pay | Admitting: Emergency Medicine

## 2012-12-09 NOTE — Telephone Encounter (Signed)
Spoke with pt in regards to pain in legs with swelling. Informed to keep next Thursday f/u appt for further assessment/blood work

## 2012-12-15 ENCOUNTER — Other Ambulatory Visit (HOSPITAL_COMMUNITY)
Admission: RE | Admit: 2012-12-15 | Discharge: 2012-12-15 | Disposition: A | Discharge status: Home / Self Care | Payer: No Typology Code available for payment source | Source: Ambulatory Visit | Attending: Family Medicine | Admitting: Family Medicine

## 2012-12-15 ENCOUNTER — Ambulatory Visit: Payer: No Typology Code available for payment source | Attending: Family Medicine | Admitting: Family Medicine

## 2012-12-15 ENCOUNTER — Encounter: Payer: Self-pay | Admitting: Family Medicine

## 2012-12-15 VITALS — BP 120/89 | HR 96 | Temp 98.9°F | Resp 16 | Ht 64.0 in | Wt 259.0 lb

## 2012-12-15 DIAGNOSIS — N390 Urinary tract infection, site not specified: Secondary | ICD-10-CM

## 2012-12-15 DIAGNOSIS — R3589 Other polyuria: Secondary | ICD-10-CM

## 2012-12-15 DIAGNOSIS — R358 Other polyuria: Secondary | ICD-10-CM

## 2012-12-15 DIAGNOSIS — Z01419 Encounter for gynecological examination (general) (routine) without abnormal findings: Secondary | ICD-10-CM | POA: Insufficient documentation

## 2012-12-15 DIAGNOSIS — Z23 Encounter for immunization: Secondary | ICD-10-CM

## 2012-12-15 DIAGNOSIS — H6122 Impacted cerumen, left ear: Secondary | ICD-10-CM

## 2012-12-15 DIAGNOSIS — Z124 Encounter for screening for malignant neoplasm of cervix: Secondary | ICD-10-CM

## 2012-12-15 DIAGNOSIS — J309 Allergic rhinitis, unspecified: Secondary | ICD-10-CM

## 2012-12-15 DIAGNOSIS — H6120 Impacted cerumen, unspecified ear: Secondary | ICD-10-CM

## 2012-12-15 DIAGNOSIS — Z113 Encounter for screening for infections with a predominantly sexual mode of transmission: Secondary | ICD-10-CM | POA: Insufficient documentation

## 2012-12-15 DIAGNOSIS — Z0000 Encounter for general adult medical examination without abnormal findings: Secondary | ICD-10-CM

## 2012-12-15 DIAGNOSIS — N760 Acute vaginitis: Secondary | ICD-10-CM | POA: Insufficient documentation

## 2012-12-15 DIAGNOSIS — Z1151 Encounter for screening for human papillomavirus (HPV): Secondary | ICD-10-CM | POA: Insufficient documentation

## 2012-12-15 LAB — LIPID PANEL
HDL: 37 mg/dL — ABNORMAL LOW (ref >39)
LDL Cholesterol: 89 mg/dL (ref 0–99)
Total CHOL/HDL Ratio: 4.6 Ratio
Triglycerides: 231 mg/dL — ABNORMAL HIGH (ref <150)

## 2012-12-15 LAB — POCT URINALYSIS DIPSTICK
Blood, UA: NEGATIVE
Nitrite, UA: NEGATIVE
Protein, UA: NEGATIVE
Urobilinogen, UA: 0.2
pH, UA: 6

## 2012-12-15 LAB — CBC
MCH: 27.4 pg (ref 26.0–34.0)
MCHC: 33.8 g/dL (ref 30.0–36.0)
MCV: 81.3 fL (ref 78.0–100.0)
Platelets: 331 10*3/uL (ref 150–400)

## 2012-12-15 LAB — COMPLETE METABOLIC PANEL WITH GFR
ALT: 10 U/L (ref 0–35)
Alkaline Phosphatase: 58 U/L (ref 39–117)
Creat: 0.64 mg/dL (ref 0.50–1.10)
GFR, Est Non African American: >89 mL/min
Sodium: 137 mEq/L (ref 135–145)
Total Bilirubin: 0.3 mg/dL (ref 0.3–1.2)
Total Protein: 6.2 g/dL (ref 6.0–8.3)

## 2012-12-15 MED ORDER — FLUTICASONE PROPIONATE 50 MCG/ACT NA SUSP: 2.0000 | Freq: Every day | NASAL | Status: DC

## 2012-12-15 NOTE — Progress Notes (Signed)
Patient ID: Kristi Ortiz, female   DOB: 12-24-1970, 42 y.o.   MRN: [id number]  CC: complete physical   HPI: The patient is presenting today for complete physical examination with Pap and pelvic.  The patient reports that she would like to be tested for STDs.  She reports that she has not been sexually active in the past 6 months.  She reports that she has had vaginal discharge.  The patient reports that she's also had several bumps appear on the body.  The patient reports that she's also having sinus congestion and postnasal drainage and left ear fullness pain.  She denies fever and chills at this time.  The patient reports that she has not been sneezing or coughing at this time.  The patient reports that she has had some frequency with urination.  She is concerned about diabetes mellitus.   No Known Allergies Past Medical History  Diagnosis Date  . Asthma   . Hypothyroid   . Sleep apnea    Current Outpatient Prescriptions on File Prior to Visit  Medication Sig Dispense Refill  . ibuprofen (ADVIL,MOTRIN) 200 MG tablet Take 200-400 mg by mouth every 6 (six) hours as needed for pain.      Marland Kitchen levothyroxine (SYNTHROID, LEVOTHROID) 125 MCG tablet Take 1 tablet (125 mcg total) by mouth daily before breakfast.  30 tablet  4   No current facility-administered medications on file prior to visit.   Family History  Problem Relation Age of Onset  . Diabetes Mother    History   Social History  . Marital Status: Single    Spouse Name: N/A    Number of Children: N/A  . Years of Education: N/A   Occupational History  . Not on file.   Social History Main Topics  . Smoking status: Never Smoker   . Smokeless tobacco: Never Used  . Alcohol Use: No  . Drug Use: No  . Sexual Activity: Not on file   Other Topics Concern  . Not on file   Social History Narrative  . No narrative on file   Review of Systems  Constitutional: Negative for fever, chills, diaphoresis, activity change, appetite  change and fatigue.  HENT: Negative for ear pain, nosebleeds, congestion, facial swelling, rhinorrhea, neck pain, neck stiffness and ear discharge.   Eyes: Negative for pain, discharge, redness, itching and visual disturbance.  Respiratory: Negative for cough, choking, chest tightness, shortness of breath, wheezing and stridor.   Cardiovascular: Negative for chest pain, palpitations and leg swelling.  Gastrointestinal: Negative for abdominal distention.  Genitourinary: Positive for dysuria, urgency, frequency. Neg for hematuria, flank pain, decreased urine volume, difficulty urinating and dyspareunia.  Musculoskeletal: Negative for back pain, joint swelling, arthralgias and gait problem.  Neurological: Negative for dizziness, tremors, seizures, syncope, facial asymmetry, speech difficulty, weakness, light-headedness, numbness and headaches.  Hematological: Negative for adenopathy. Does not bruise/bleed easily.  Psychiatric/Behavioral: Negative for hallucinations, behavioral problems, confusion, dysphoric mood, decreased concentration and agitation.   Objective:   Filed Vitals:   12/15/12 1149  BP: 120/89  Pulse: 96  Temp: 98.9 F (37.2 C)  Resp: 16   Physical Exam  Constitutional: Appears well-developed and well-nourished. No distress.  HENT: Normocephalic. External right and left ear normal. Oropharynx is clear and moist. swollen nasal turbinates. Eyes: Conjunctivae and EOM are normal. PERRLA, no scleral icterus.  Neck: Normal ROM. Neck supple. No JVD. No tracheal deviation. No thyromegaly.  CVS: RRR, S1/S2 +, no murmurs, no gallops, no carotid bruit.  Pulmonary:  Effort and breath sounds normal, no stridor, rhonchi, wheezes, rales.  Abdominal: Soft. BS +,  no distension, tenderness, rebound or guarding.  Musculoskeletal: Normal range of motion. No edema and no tenderness.  GU: normal external genitals. No vaginal discharge seen. No CMT.  Lymphadenopathy: No lymphadenopathy noted,  cervical, inguinal. Neuro: Alert. Normal reflexes, muscle tone coordination. No cranial nerve deficit. Skin: Skin is warm and dry. No rash noted. Not diaphoretic. No erythema. No pallor.  Psychiatric: Normal mood and affect. Behavior, judgment, thought content normal.   Lab Results  Component Value Date   WBC 6.4 09/02/2012   HGB 12.4 09/02/2012   HCT 38.0 09/02/2012   MCV 87.0 09/02/2012   PLT 247 09/02/2012   Lab Results  Component Value Date   CREATININE 0.58 09/02/2012   BUN 10 09/02/2012   NA 136 09/02/2012   K 3.8 09/02/2012   CL 103 09/02/2012   CO2 27 09/02/2012    No results found for this basename: HGBA1C   Lipid Panel     Component Value Date/Time   CHOL 198 09/15/2012 1409   TRIG 218* 09/15/2012 1409   HDL 38* 09/15/2012 1409   CHOLHDL 5.2 09/15/2012 1409   VLDL 44* 09/15/2012 1409   LDLCALC 116* 09/15/2012 1409      Assessment and plan:   Patient Active Problem List   Diagnosis Date Noted  . Allergic rhinitis 12/15/2012  . Cerumen impaction 12/15/2012  . Routine adult health maintenance 12/15/2012  . Hypothyroidism 09/01/2012  . OSA (obstructive sleep apnea) 09/01/2012  . Memory difficulty 09/01/2012  . Asthma 09/01/2012  . Edema 09/01/2012  . Unspecified hypothyroidism 08/11/2012   UTI (urinary tract infection) - Plan: Urinalysis Dipstick, Cytology - PAP [place], CBC, COMPLETE METABOLIC PANEL WITH GFR, Lipid panel, POCT glycosylated hemoglobin (Hb A1C)  Routine adult health maintenance - Plan: Cytology - PAP [place], CBC, COMPLETE METABOLIC PANEL WITH GFR, Lipid panel, POCT glycosylated hemoglobin (Hb A1C), MM Digital Screening  Screening for cervical cancer - Plan: Cytology - PAP [place], CBC, COMPLETE METABOLIC PANEL WITH GFR, Lipid panel, POCT glycosylated hemoglobin (Hb A1C)  Papanicolaou smear for cervical cancer screening - Plan: Cytology - PAP [place], CBC, COMPLETE METABOLIC PANEL WITH GFR, Lipid panel, POCT glycosylated hemoglobin (Hb  A1C)  Polyuria - Plan: POCT glycosylated hemoglobin (Hb A1C)  Cerumen impaction, left  Allergic rhinitis  Pap and pelvic completed today.  Cytology sent for GC Chlamydia, Trichomonas, herpes, bacterial vaginosis.   Check urinalysis.   Patient does have impacted cerumen in the left ear canal.  The patient was given instructions to obtain over-the-counter ear drops to soften the wax and return to clinic in 2 weeks for an ear lavage.  Flu vaccine provided today.  Screening mammogram at [facility].  Followup lab results  RTC in 2 weeks for left ear lavage  The patient was given clear instructions to go to ER or return to medical center if symptoms don't improve, worsen or new problems develop.  The patient verbalized understanding.  The patient was told to call to get any lab results if not heard anything in the next week.    Rodney Langton, MD, CDE, FAAFP [facility] [place], [place]

## 2012-12-15 NOTE — Patient Instructions (Addendum)
Allergic Rhinitis Allergic rhinitis is when the mucous membranes in the nose respond to allergens. Allergens are particles in the air that cause your body to have an allergic reaction. This causes you to release allergic antibodies. Through a chain of events, these eventually cause you to release histamine into the blood stream (hence the use of antihistamines). Although meant to be protective to the body, it is this release that causes your discomfort, such as frequent sneezing, congestion and an itchy runny nose.  CAUSES  The pollen allergens may come from grasses, trees, and weeds. This is seasonal allergic rhinitis, or "hay fever." Other allergens cause year-round allergic rhinitis (perennial allergic rhinitis) such as house dust mite allergen, pet dander and mold spores.  SYMPTOMS   Nasal stuffiness (congestion).  Runny, itchy nose with sneezing and tearing of the eyes.  There is often an itching of the mouth, eyes and ears. It cannot be cured, but it can be controlled with medications. DIAGNOSIS  If you are unable to determine the offending allergen, skin or blood testing may find it. TREATMENT   Avoid the allergen.  Medications and allergy shots (immunotherapy) can help.  Hay fever may often be treated with antihistamines in pill or nasal spray forms. Antihistamines block the effects of histamine. There are over-the-counter medicines that may help with nasal congestion and swelling around the eyes. Check with your caregiver before taking or giving this medicine. If the treatment above does not work, there are many new medications your caregiver can prescribe. Stronger medications may be used if initial measures are ineffective. Desensitizing injections can be used if medications and avoidance fails. Desensitization is when a patient is given ongoing shots until the body becomes less sensitive to the allergen. Make sure you follow up with your caregiver if problems continue. SEEK MEDICAL  CARE IF:   You develop fever (more than 100.5 F (38.1 C).  You develop a cough that does not stop easily (persistent).  You have shortness of breath.  You start wheezing.  Symptoms interfere with normal daily activities. Document Released: 11/25/2000 Document Revised: 05/25/2011 Document Reviewed: 06/06/2008 [facility] Patient Information 2014 [place], [place].   Cerumen Impaction A cerumen impaction is when the wax in your ear forms a plug. This plug usually causes reduced hearing. Sometimes it also causes an earache or dizziness. Removing a cerumen impaction can be difficult and painful. The wax sticks to the ear canal. The canal is sensitive and bleeds easily. If you try to remove a heavy wax buildup with a cotton tipped swab, you may push it in further. Irrigation with water, suction, and small ear curettes may be used to clear out the wax. If the impaction is fixed to the skin in the ear canal, ear drops may be needed for a few days to loosen the wax. People who build up a lot of wax frequently can use ear wax removal products available in your local drugstore. SEEK MEDICAL CARE IF:  You develop an earache, increased hearing loss, or marked dizziness. Document Released: 04/09/2004 Document Revised: 05/25/2011 Document Reviewed: 05/30/2009 [facility] Patient Information 2014 [place], [place].     Pap Test A Pap test checks the cells on the surface of your cervix. Your doctor will look for cell changes that are not normal, an infection, or cancer. If the cells no longer look normal, it is called dysplasia. Dysplasia can turn into cancer. Regular Pap tests are important to stop cancer from developing. BEFORE THE PROCEDURE  Ask your doctor when  to schedule your Pap test. Timing the test around your period may be important.  Do not douche or have sex (intercourse) for 24 hours before the test.  Do not put creams on your vagina or use tampons for 24 hours before the test.  Go pee  (urinate) just before the test. PROCEDURE  You will lie on an exam table with your feet in stirrups.  A warm metal or plastic tool (speculum) will be put in your vagina to open it up.  Your doctor will use a small, plastic brush or wooden spatula to take cells from your cervix.  The cells will be put in a lab container.  The cells will be checked under a microscope to see if they are normal or not. AFTER THE PROCEDURE Get your test results. If they are abnormal, you may need more tests. Document Released: 04/04/2010 Document Revised: 05/25/2011 Document Reviewed: 02/26/2011 [facility] Patient Information 2014 [place], [place].

## 2012-12-16 LAB — URINE CULTURE: Colony Count: NO GROWTH

## 2012-12-16 NOTE — Progress Notes (Signed)
Quick Note:  Please inform patient that her labs came back stable and her cholesterol numbers are improving. Recheck labs in 4 months.   Rodney Langton, MD, CDE, FAAFP [facility] [place], [place]   ______

## 2012-12-19 ENCOUNTER — Telehealth: Payer: Self-pay | Admitting: Emergency Medicine

## 2012-12-19 NOTE — Telephone Encounter (Signed)
Message copied by Darlis Loan on Mon Dec 19, 2012 10:38 AM ------      Message from: Cleora Fleet      Created: Fri Dec 16, 2012  7:33 AM       Please inform patient that her labs came back stable and her cholesterol numbers are improving.  Recheck labs in 4 months.             Rodney Langton, MD, CDE, FAAFP      [facility]      [facility]      [place], [place]        ------

## 2012-12-19 NOTE — Telephone Encounter (Signed)
Pt given lab results.pt will schedule lab visit closer to 4 mnth f/u

## 2012-12-20 ENCOUNTER — Telehealth: Payer: Self-pay | Admitting: Internal Medicine

## 2012-12-20 ENCOUNTER — Ambulatory Visit: Payer: No Typology Code available for payment source | Admitting: Internal Medicine

## 2012-12-20 NOTE — Telephone Encounter (Signed)
Pt here on 12/15/12 and calling about pap results.  Also would like to know if thyroid level was checked in bloodwork.  Please f/u with pt.

## 2012-12-21 ENCOUNTER — Telehealth: Payer: Self-pay | Admitting: Emergency Medicine

## 2012-12-21 NOTE — Telephone Encounter (Signed)
Message copied by Darlis Loan on Wed Dec 21, 2012 12:01 PM ------      Message from: Cleora Fleet      Created: Wed Dec 21, 2012 11:34 AM       Please inform patient that her PAP test came back normal.  No HPV was found.  Gonorrhea, Chlamydia, yeast and trichomonas tests came back negative.              Rodney Langton, MD, CDE, FAAFP      [facility]      [facility]      [place], [place]        ------

## 2012-12-21 NOTE — Progress Notes (Signed)
Quick Note:  Please inform patient that her PAP test came back normal. No HPV was found. Gonorrhea, Chlamydia, yeast and trichomonas tests came back negative.   Rodney Langton, MD, CDE, FAAFP [facility] [place], [place]   ______

## 2012-12-21 NOTE — Progress Notes (Signed)
Pt given negative test results 

## 2012-12-21 NOTE — Telephone Encounter (Signed)
Pt given test results 

## 2012-12-21 NOTE — Progress Notes (Signed)
Quick Note:  Please inform patient that her herpes test came back negative.   Rodney Langton, MD, CDE, FAAFP [facility] [place], [place]   ______

## 2012-12-28 NOTE — Telephone Encounter (Signed)
Results given last week 12/19/12

## 2013-01-04 ENCOUNTER — Ambulatory Visit: Payer: No Typology Code available for payment source | Attending: Internal Medicine | Admitting: Internal Medicine

## 2013-01-04 VITALS — BP 119/76 | HR 85 | Temp 97.9°F | Resp 17

## 2013-01-04 DIAGNOSIS — G4733 Obstructive sleep apnea (adult) (pediatric): Secondary | ICD-10-CM

## 2013-01-04 DIAGNOSIS — E039 Hypothyroidism, unspecified: Secondary | ICD-10-CM

## 2013-01-04 NOTE — Progress Notes (Signed)
Patient here for follow up Complains of pain to left ear Would like her thyroid check

## 2013-01-04 NOTE — Progress Notes (Signed)
Patient ID: Kristi Ortiz, female   DOB: Oct 02, 1970, 43 y.o.   MRN: [id number] Patient Demographics  Kristi Ortiz, is a 42 y.o. female  WUJ:[id number]  [id number]  DOB - [birth year]  Chief Complaint  Patient presents with  . Follow-up        Subjective:   Kristi Ortiz is a 42 y.o. female here today for a follow up visit. She has no complaints today. She wants her thyroid checked and also thyroid function test. No symptoms of hypothyroidism or hyperparathyroidism. No change in bowel habit Patient has No headache, No chest pain, No abdominal pain - No Nausea, No new weakness tingling or numbness, No Cough - SOB.  ALLERGIES: No Known Allergies  PAST MEDICAL HISTORY: Past Medical History  Diagnosis Date  . Asthma   . Hypothyroid   . Sleep apnea     MEDICATIONS AT HOME: Prior to Admission medications   Medication Sig Start Date End Date Taking? Authorizing Provider  fluticasone (FLONASE) 50 MCG/ACT nasal spray Place 2 sprays into the nose daily. 12/15/12   [place] Cyndie Mull, MD  ibuprofen (ADVIL,MOTRIN) 200 MG tablet Take 200-400 mg by mouth every 6 (six) hours as needed for pain.    Historical Provider, MD  levothyroxine (SYNTHROID, LEVOTHROID) 125 MCG tablet Take 1 tablet (125 mcg total) by mouth daily before breakfast. 10/18/12   [place]an[place] Cyndie Mull, MD     Objective:   Filed Vitals:   01/04/13 1050  BP: 119/76  Pulse: 85  Temp: 97.9 F (36.6 C)  Resp: 17  SpO2: 100%    Exam General appearance : Awake, alert, not in any distress. Speech Clear. Not toxic looking. Morbidly obese, unkempt HEENT: Atraumatic and Normocephalic, pupils equally reactive to light and accomodation Neck: supple, no JVD. No cervical lymphadenopathy.  Chest:Good air entry bilaterally, no added sounds  CVS: S1 S2 regular, no murmurs.  Abdomen: Bowel sounds present, Non tender and not distended with no gaurding, rigidity or rebound. Extremities: B/L Lower Ext shows no edema, both legs  are warm to touch Neurology: Awake alert, and oriented X 3, CN II-XII intact, Non focal Skin:No Rash Wounds:N/A   Data Review   CBC No results found for this basename: WBC, HGB, HCT, PLT, MCV, MCH, MCHC, RDW, NEUTRABS, LYMPHSABS, MONOABS, EOSABS, BASOSABS, BANDABS, BANDSABD,  in the last 168 hours  Chemistries   No results found for this basename: NA, K, CL, CO2, GLUCOSE, BUN, CREATININE, GFRCGP, CALCIUM, MG, AST, ALT, ALKPHOS, BILITOT,  in the last 168 hours ------------------------------------------------------------------------------------------------------------------ No results found for this basename: HGBA1C,  in the last 72 hours ------------------------------------------------------------------------------------------------------------------ No results found for this basename: CHOL, HDL, LDLCALC, TRIG, CHOLHDL, LDLDIRECT,  in the last 72 hours ------------------------------------------------------------------------------------------------------------------ No results found for this basename: TSH, T4TOTAL, FREET3, T3FREE, THYROIDAB,  in the last 72 hours ------------------------------------------------------------------------------------------------------------------ No results found for this basename: VITAMINB12, FOLATE, FERRITIN, TIBC, IRON, RETICCTPCT,  in the last 72 hours  Coagulation profile  No results found for this basename: INR, PROTIME,  in the last 168 hours    Assessment & Plan   Patient Active Problem List   Diagnosis Date Noted  . Allergic rhinitis 12/15/2012  . Cerumen impaction 12/15/2012  . Routine adult health maintenance 12/15/2012  . Hypothyroidism 09/01/2012  . OSA (obstructive sleep apnea) 09/01/2012  . Memory difficulty 09/01/2012  . Asthma 09/01/2012  . Edema 09/01/2012  . Unspecified hypothyroidism 08/11/2012     Plan: Patient counseled extensively about nutrition and exercise Patient was educated about thyroid function and  that we do  not need to check it every so often, since she just began to take medications for hypothyroidism, we will wait for 6 months to recheck his thyroid function.   Health Maintenance -Mammogram: Schedule for next month -Vaccinations:  -Influenza already given  Follow up in    The patient was given clear instructions to go to ER or return to medical center if symptoms don't improve, worsen or new problems develop. The patient verbalized understanding. The patient was told to call to get lab results if they haven't heard anything in the next week.    Jeanann Lewandowsky, MD, MHA, FACP, FAAP [facility] [place], [place] [phone]   01/04/2013, 11:26 AM

## 2013-01-04 NOTE — Patient Instructions (Signed)
DASH Diet  The DASH diet stands for "Dietary Approaches to Stop Hypertension." It is a healthy eating plan that has been shown to reduce high blood pressure (hypertension) in as little as 14 days, while also possibly providing other significant health benefits. These other health benefits include reducing the risk of breast cancer after menopause and reducing the risk of type 2 diabetes, heart disease, colon cancer, and stroke. Health benefits also include weight loss and slowing kidney failure in patients with chronic kidney disease.   DIET GUIDELINES  · Limit salt (sodium). Your diet should contain less than 1500 mg of sodium daily.  · Limit refined or processed carbohydrates. Your diet should include mostly whole grains. Desserts and added sugars should be used sparingly.  · Include small amounts of heart-healthy fats. These types of fats include nuts, oils, and tub margarine. Limit saturated and trans fats. These fats have been shown to be harmful in the body.  CHOOSING FOODS   The following food groups are based on a 2000 calorie diet. See your Registered Dietitian for individual calorie needs.  Grains and Grain Products (6 to 8 servings daily)  · Eat More Often: Whole-wheat bread, brown rice, whole-grain or wheat pasta, quinoa, popcorn without added fat or salt (air popped).  · Eat Less Often: White bread, white pasta, white rice, cornbread.  Vegetables (4 to 5 servings daily)  · Eat More Often: Fresh, frozen, and canned vegetables. Vegetables may be raw, steamed, roasted, or grilled with a minimal amount of fat.  · Eat Less Often/Avoid: Creamed or fried vegetables. Vegetables in a cheese sauce.  Fruit (4 to 5 servings daily)  · Eat More Often: All fresh, canned (in natural juice), or frozen fruits. Dried fruits without added sugar. One hundred percent fruit juice (½ cup [237 mL] daily).  · Eat Less Often: Dried fruits with added sugar. Canned fruit in light or heavy syrup.  Lean Meats, Fish, and Poultry (2  servings or less daily. One serving is 3 to 4 oz [85-114 g]).  · Eat More Often: Ninety percent or leaner ground beef, tenderloin, sirloin. Round cuts of beef, chicken breast, turkey breast. All fish. Grill, bake, or broil your meat. Nothing should be fried.  · Eat Less Often/Avoid: Fatty cuts of meat, turkey, or chicken leg, thigh, or wing. Fried cuts of meat or fish.  Dairy (2 to 3 servings)  · Eat More Often: Low-fat or fat-free milk, low-fat plain or light yogurt, reduced-fat or part-skim cheese.  · Eat Less Often/Avoid: Milk (whole, 2%). Whole milk yogurt. Full-fat cheeses.  Nuts, Seeds, and Legumes (4 to 5 servings per week)  · Eat More Often: All without added salt.  · Eat Less Often/Avoid: Salted nuts and seeds, canned beans with added salt.  Fats and Sweets (limited)  · Eat More Often: Vegetable oils, tub margarines without trans fats, sugar-free gelatin. Mayonnaise and salad dressings.  · Eat Less Often/Avoid: Coconut oils, palm oils, butter, stick margarine, cream, half and half, cookies, candy, pie.  FOR MORE INFORMATION  The Dash Diet Eating Plan: www.dashdiet.org  Document Released: 02/19/2011 Document Revised: 05/25/2011 Document Reviewed: 02/19/2011  [employer]® Patient Information ©2014 [employer].

## 2013-01-12 ENCOUNTER — Ambulatory Visit (HOSPITAL_COMMUNITY): Payer: No Typology Code available for payment source

## 2013-01-25 ENCOUNTER — Ambulatory Visit (HOSPITAL_COMMUNITY): Payer: No Typology Code available for payment source | Attending: Family Medicine

## 2013-02-07 ENCOUNTER — Ambulatory Visit: Payer: No Typology Code available for payment source

## 2013-02-17 ENCOUNTER — Ambulatory Visit: Payer: Self-pay

## 2013-02-28 ENCOUNTER — Ambulatory Visit: Payer: No Typology Code available for payment source | Attending: Internal Medicine | Admitting: Pharmacist

## 2013-02-28 VITALS — BP 128/86 | HR 70 | Temp 98.0°F | Resp 15

## 2013-02-28 DIAGNOSIS — Z0289 Encounter for other administrative examinations: Secondary | ICD-10-CM

## 2013-02-28 DIAGNOSIS — Z020 Encounter for examination for admission to educational institution: Secondary | ICD-10-CM

## 2013-03-02 ENCOUNTER — Ambulatory Visit (HOSPITAL_COMMUNITY): Payer: Self-pay

## 2013-03-02 ENCOUNTER — Ambulatory Visit: Payer: No Typology Code available for payment source | Attending: Internal Medicine | Admitting: Pharmacist

## 2013-03-02 LAB — TB SKIN TEST: Induration: 0 mm

## 2013-03-02 NOTE — Progress Notes (Signed)
Patient here to have her TB test read

## 2013-03-16 HISTORY — PX: DENTAL SURGERY: SHX609

## 2013-03-23 ENCOUNTER — Ambulatory Visit: Payer: Self-pay

## 2013-03-30 ENCOUNTER — Telehealth: Payer: Self-pay

## 2013-03-30 ENCOUNTER — Ambulatory Visit: Payer: No Typology Code available for payment source | Admitting: Internal Medicine

## 2013-03-30 ENCOUNTER — Telehealth: Payer: Self-pay | Admitting: Internal Medicine

## 2013-03-30 ENCOUNTER — Other Ambulatory Visit: Payer: No Typology Code available for payment source

## 2013-03-30 MED ORDER — LEVOTHYROXINE SODIUM 125 MCG PO TABS: 125.0000 ug | ORAL_TABLET | Freq: Every day | ORAL | Status: DC

## 2013-03-30 NOTE — Telephone Encounter (Signed)
Spoke with patient  Refilled thyroid medication Sent to [facility] on [address]

## 2013-03-30 NOTE — Telephone Encounter (Signed)
Pt says she needs med refill for levothyroxine (SYNTHROID, LEVOTHROID) 125 MCG tablet and has next appt scheduled for 04/06/13. Please f[facility]u with [facility] [address]

## 2013-04-06 ENCOUNTER — Ambulatory Visit: Payer: No Typology Code available for payment source | Attending: Internal Medicine | Admitting: Internal Medicine

## 2013-04-06 ENCOUNTER — Encounter: Payer: Self-pay | Admitting: Internal Medicine

## 2013-04-06 VITALS — BP 110/80 | HR 86 | Temp 98.7°F | Resp 14 | Ht 65.0 in | Wt 259.8 lb

## 2013-04-06 DIAGNOSIS — E039 Hypothyroidism, unspecified: Secondary | ICD-10-CM | POA: Insufficient documentation

## 2013-04-06 LAB — TSH: TSH: 0.867 u[IU]/mL (ref 0.350–4.500)

## 2013-04-06 LAB — LIPID PANEL
CHOL/HDL RATIO: 3.7 ratio
Cholesterol: 161 mg/dL (ref 0–200)
HDL: 43 mg/dL (ref >39)
LDL Cholesterol: 87 mg/dL (ref 0–99)
Triglycerides: 155 mg/dL — ABNORMAL HIGH (ref <150)
VLDL: 31 mg/dL (ref 0–40)

## 2013-04-06 LAB — T4, FREE: FREE T4: 1.36 ng/dL (ref 0.80–1.80)

## 2013-04-06 LAB — T3, FREE: T3 FREE: 3 pg/mL (ref 2.3–4.2)

## 2013-04-06 NOTE — Progress Notes (Signed)
Patient ID: Kristi Ortiz, female   DOB: 25-Mar-1970, 42 y.o.   MRN: [id number]   CC:  HPI: 43 year old female with a history of hypothyroidism, who presents to the clinic for a followup. The patient has been compliant with her thyroid medication, she claims that she has gained a significant amount of weight. She does not exercise cannot afford to go to the gym.  Thyroid function was normal on her last check   Allergies  Allergen Reactions  . Mold Extract [Trichophyton]     Cold, asthma, occurs in spells   Past Medical History  Diagnosis Date  . Asthma   . Hypothyroid   . Sleep apnea    Current Outpatient Prescriptions on File Prior to Visit  Medication Sig Dispense Refill  . ibuprofen (ADVIL,MOTRIN) 200 MG tablet Take 200-400 mg by mouth every 6 (six) hours as needed for pain.      Marland Kitchen levothyroxine (SYNTHROID, LEVOTHROID) 125 MCG tablet Take 1 tablet (125 mcg total) by mouth daily before breakfast.  30 tablet  4  . fluticasone (FLONASE) 50 MCG/ACT nasal spray Place 2 sprays into the nose daily.  16 g  6   No current facility-administered medications on file prior to visit.   Family History  Problem Relation Age of Onset  . Diabetes Mother    History   Social History  . Marital Status: Single    Spouse Name: N/A    Number of Children: N/A  . Years of Education: N/A   Occupational History  . Not on file.   Social History Main Topics  . Smoking status: Never Smoker   . Smokeless tobacco: Never Used  . Alcohol Use: No  . Drug Use: No  . Sexual Activity: Not on file   Other Topics Concern  . Not on file   Social History Narrative  . No narrative on file    Review of Systems  Constitutional: Negative for fever, chills, diaphoresis, activity change, appetite change and fatigue.  HENT: Negative for ear pain, nosebleeds, congestion, facial swelling, rhinorrhea, neck pain, neck stiffness and ear discharge.   Eyes: Negative for pain, discharge, redness, itching and  visual disturbance.  Respiratory: Negative for cough, choking, chest tightness, shortness of breath, wheezing and stridor.   Cardiovascular: Negative for chest pain, palpitations and leg swelling.  Gastrointestinal: Negative for abdominal distention.  Genitourinary: Negative for dysuria, urgency, frequency, hematuria, flank pain, decreased urine volume, difficulty urinating and dyspareunia.  Musculoskeletal: Negative for back pain, joint swelling, arthralgias and gait problem.  Neurological: Negative for dizziness, tremors, seizures, syncope, facial asymmetry, speech difficulty, weakness, light-headedness, numbness and headaches.  Hematological: Negative for adenopathy. Does not bruise/bleed easily.  Psychiatric/Behavioral: Negative for hallucinations, behavioral problems, confusion, dysphoric mood, decreased concentration and agitation.    Objective:   Filed Vitals:   04/06/13 1105  BP: 110/80  Pulse: 86  Temp: 98.7 F (37.1 C)  Resp: 14    Physical Exam  Constitutional: Appears well-developed and well-nourished. No distress.  HENT: Normocephalic. External right and left ear normal. Oropharynx is clear and moist.  Eyes: Conjunctivae and EOM are normal. PERRLA, no scleral icterus.  Neck: Normal ROM. Neck supple. No JVD. No tracheal deviation. No thyromegaly.  CVS: RRR, S1/S2 +, no murmurs, no gallops, no carotid bruit.  Pulmonary: Effort and breath sounds normal, no stridor, rhonchi, wheezes, rales.  Abdominal: Soft. BS +,  no distension, tenderness, rebound or guarding.  Musculoskeletal: Normal range of motion. No edema and no tenderness.  Lymphadenopathy: No  lymphadenopathy noted, cervical, inguinal. Neuro: Alert. Normal reflexes, muscle tone coordination. No cranial nerve deficit. Skin: Skin is warm and dry. No rash noted. Not diaphoretic. No erythema. No pallor.  Psychiatric: Normal mood and affect. Behavior, judgment, thought content normal.   Lab Results  Component Value  Date   WBC 8.3 12/15/2012   HGB 13.2 12/15/2012   HCT 39.1 12/15/2012   MCV 81.3 12/15/2012   PLT 331 12/15/2012   Lab Results  Component Value Date   CREATININE 0.64 12/15/2012   BUN 9 12/15/2012   NA 137 12/15/2012   K 3.8 12/15/2012   CL 104 12/15/2012   CO2 27 12/15/2012    No results found for this basename: HGBA1C   Lipid Panel     Component Value Date/Time   CHOL 172 12/15/2012 1207   TRIG 231* 12/15/2012 1207   HDL 37* 12/15/2012 1207   CHOLHDL 4.6 12/15/2012 1207   VLDL 46* 12/15/2012 1207   LDLCALC 89 12/15/2012 1207       Assessment and plan:   Patient Active Problem List   Diagnosis Date Noted  . Allergic rhinitis 12/15/2012  . Cerumen impaction 12/15/2012  . Routine adult health maintenance 12/15/2012  . Hypothyroidism 09/01/2012  . OSA (obstructive sleep apnea) 09/01/2012  . Memory difficulty 09/01/2012  . Asthma 09/01/2012  . Edema 09/01/2012  . Unspecified hypothyroidism 08/11/2012       Hypothyroidism Patient morbidly obese and I have strongly recommended her to exercise by going to a gym and getting into an exercise routine Will check TSH, free T4, T3, lipid panel She has no other complaints Follow up in 3 months   The patient was given clear instructions to go to ER or return to medical center if symptoms don't improve, worsen or new problems develop. The patient verbalized understanding. The patient was told to call to get any lab results if not heard anything in the next week.

## 2013-04-06 NOTE — Progress Notes (Signed)
Pt is here for an office visit. Pt requests lab work to check her thyroid. Has some questions about caffeine.

## 2013-04-08 ENCOUNTER — Telehealth: Payer: Self-pay | Admitting: *Deleted

## 2013-04-08 NOTE — Telephone Encounter (Signed)
Left a voicemail for pt to give us a call.

## 2013-04-08 NOTE — Telephone Encounter (Signed)
Message copied by Hinata Diener, [place] R on Sat Apr 08, 2013 10:01 AM ------      Message from: Susie CassetteABROL MD, Germain OsgoodNAYANA      Created: Fri Apr 07, 2013  5:13 PM       Notify patient of the thyroid function and lipid panel is normal ------

## 2013-04-11 ENCOUNTER — Telehealth: Payer: Self-pay | Admitting: Internal Medicine

## 2013-04-11 NOTE — Telephone Encounter (Signed)
Left a voicemail for pt to give us a call back. Notify patient of the thyroid function and lipid panel is normal.

## 2013-04-11 NOTE — Telephone Encounter (Signed)
Pt returning call about results for Tyroid exam.  Please f/u with pt, she is available today, 1/27, before 1pm.

## 2013-04-14 ENCOUNTER — Telehealth: Payer: Self-pay | Admitting: Internal Medicine

## 2013-04-14 NOTE — Telephone Encounter (Signed)
Patient is aware of her lab results 

## 2013-04-14 NOTE — Telephone Encounter (Signed)
Pt calling about results for blood work, specifically Tyroid levels. Please f/u with pt.

## 2013-04-27 ENCOUNTER — Ambulatory Visit: Payer: No Typology Code available for payment source | Admitting: Internal Medicine

## 2013-07-05 ENCOUNTER — Ambulatory Visit: Payer: No Typology Code available for payment source | Admitting: Internal Medicine

## 2013-07-24 ENCOUNTER — Telehealth: Payer: Self-pay | Admitting: Internal Medicine

## 2013-07-24 NOTE — Telephone Encounter (Signed)
Pt has cough with discolored mucus and sore throat.  Would like to come in for SDA appt.  Please f/u with pt.

## 2013-07-28 ENCOUNTER — Ambulatory Visit: Payer: No Typology Code available for payment source | Attending: Internal Medicine | Admitting: Internal Medicine

## 2013-07-28 ENCOUNTER — Encounter: Payer: Self-pay | Admitting: Internal Medicine

## 2013-07-28 VITALS — BP 112/78 | HR 97 | Temp 97.8°F | Resp 16 | Wt 265.0 lb

## 2013-07-28 DIAGNOSIS — Z139 Encounter for screening, unspecified: Secondary | ICD-10-CM

## 2013-07-28 DIAGNOSIS — J3489 Other specified disorders of nose and nasal sinuses: Secondary | ICD-10-CM

## 2013-07-28 DIAGNOSIS — R05 Cough: Secondary | ICD-10-CM

## 2013-07-28 DIAGNOSIS — J45909 Unspecified asthma, uncomplicated: Secondary | ICD-10-CM | POA: Insufficient documentation

## 2013-07-28 DIAGNOSIS — G4730 Sleep apnea, unspecified: Secondary | ICD-10-CM | POA: Insufficient documentation

## 2013-07-28 DIAGNOSIS — R059 Cough, unspecified: Secondary | ICD-10-CM

## 2013-07-28 DIAGNOSIS — J069 Acute upper respiratory infection, unspecified: Secondary | ICD-10-CM | POA: Insufficient documentation

## 2013-07-28 DIAGNOSIS — Z79899 Other long term (current) drug therapy: Secondary | ICD-10-CM | POA: Insufficient documentation

## 2013-07-28 DIAGNOSIS — E039 Hypothyroidism, unspecified: Secondary | ICD-10-CM | POA: Insufficient documentation

## 2013-07-28 DIAGNOSIS — R0981 Nasal congestion: Secondary | ICD-10-CM | POA: Insufficient documentation

## 2013-07-28 LAB — COMPLETE METABOLIC PANEL WITH GFR
ALK PHOS: 65 U/L (ref 39–117)
ALT: 15 U/L (ref 0–35)
AST: 14 U/L (ref 0–37)
Albumin: 4 g/dL (ref 3.5–5.2)
BILIRUBIN TOTAL: 0.4 mg/dL (ref 0.2–1.2)
BUN: 11 mg/dL (ref 6–23)
CO2: 23 meq/L (ref 19–32)
Calcium: 9.1 mg/dL (ref 8.4–10.5)
Chloride: 103 mEq/L (ref 96–112)
Creat: 0.57 mg/dL (ref 0.50–1.10)
GFR, Est African American: >89 mL/min
GFR, Est Non African American: >89 mL/min
Glucose, Bld: 107 mg/dL — ABNORMAL HIGH (ref 70–99)
Potassium: 4.2 mEq/L (ref 3.5–5.3)
SODIUM: 138 meq/L (ref 135–145)
TOTAL PROTEIN: 6.3 g/dL (ref 6.0–8.3)

## 2013-07-28 MED ORDER — AZITHROMYCIN 250 MG PO TABS: ORAL_TABLET | ORAL | Status: DC

## 2013-07-28 MED ORDER — BENZONATATE 100 MG PO CAPS: 100.0000 mg | ORAL_CAPSULE | Freq: Three times a day (TID) | ORAL | Status: DC | PRN

## 2013-07-28 MED ORDER — FLUTICASONE PROPIONATE 50 MCG/ACT NA SUSP: 2.0000 | Freq: Every day | NASAL | Status: DC

## 2013-07-28 NOTE — Progress Notes (Signed)
MRN: [id number] Name: Kristi Ortiz  Sex: female Age: 43 y.o. DOB: 1971-01-29  Allergies: Mold extract  Chief Complaint  Patient presents with  . Follow-up    HPI: Patient is 43 y.o. female who has to of hypothyroidism, comes today for followup, she reported to have lot of URI symptoms nasal congestion postnasal drip productive cough yellowish to greenish in color for the last several days denies any fever chills chest pain or shortness of breath, she denies smoking cigarettes, patient has not tried any over-the-counter medication and does report worsening of the symptoms.  Past Medical History  Diagnosis Date  . Asthma   . Hypothyroid   . Sleep apnea     Past Surgical History  Procedure Laterality Date  . Dental surgery        Medication List       This list is accurate as of: 07/28/13  2:49 PM.  Always use your most recent med list.               azithromycin 250 MG tablet  Commonly known as:  ZITHROMAX Z-PAK  Take as directed     benzonatate 100 MG capsule  Commonly known as:  TESSALON  Take 1 capsule (100 mg total) by mouth 3 (three) times daily as needed for cough.     cetirizine 10 MG chewable tablet  Commonly known as:  ZYRTEC  Chew 10 mg by mouth daily.     fluticasone 50 MCG/ACT nasal spray  Commonly known as:  FLONASE  Place 2 sprays into both nostrils daily.     ibuprofen 200 MG tablet  Commonly known as:  ADVIL,MOTRIN  Take 200-400 mg by mouth every 6 (six) hours as needed for pain.     levothyroxine 125 MCG tablet  Commonly known as:  SYNTHROID, LEVOTHROID  Take 1 tablet (125 mcg total) by mouth daily before breakfast.        Meds ordered this encounter  Medications  . azithromycin (ZITHROMAX Z-PAK) 250 MG tablet    Sig: Take as directed    Dispense:  6 each    Refill:  0  . benzonatate (TESSALON) 100 MG capsule    Sig: Take 1 capsule (100 mg total) by mouth 3 (three) times daily as needed for cough.    Dispense:  30 capsule    Refill:  1  . fluticasone (FLONASE) 50 MCG/ACT nasal spray    Sig: Place 2 sprays into both nostrils daily.    Dispense:  16 g    Refill:  6    Immunization History  Administered Date(s) Administered  . Influenza Split 12/15/2012  . PPD Test 02/28/2013    Family History  Problem Relation Age of Onset  . Diabetes Mother     History  Substance Use Topics  . Smoking status: Never Smoker   . Smokeless tobacco: Never Used  . Alcohol Use: No    Review of Systems   As noted in HPI  Filed Vitals:   07/28/13 1436  BP: 112/78  Pulse: 97  Temp: 97.8 F (36.6 C)  Resp: 16    Physical Exam  Physical Exam  Constitutional: No distress.  HENT:  Nasal congestion, minimal sinus tenderness, minimal pharyngeal erythema, enlarged tonsils no exudate  Eyes: EOM are normal. Pupils are equal, round, and reactive to light.  Cardiovascular: Normal rate and regular rhythm.   Pulmonary/Chest: Breath sounds normal. No respiratory distress. She has no wheezes. She has no rales.  CBC    Component Value Date/Time   WBC 8.3 12/15/2012 1207   RBC 4.81 12/15/2012 1207   HGB 13.2 12/15/2012 1207   HCT 39.1 12/15/2012 1207   PLT 331 12/15/2012 1207   MCV 81.3 12/15/2012 1207   LYMPHSABS 1.8 09/01/2012 1415   MONOABS 0.6 09/01/2012 1415   EOSABS 0.3 09/01/2012 1415   BASOSABS 0.0 09/01/2012 1415    CMP     Component Value Date/Time   NA 137 12/15/2012 1207   K 3.8 12/15/2012 1207   CL 104 12/15/2012 1207   CO2 27 12/15/2012 1207   GLUCOSE 101* 12/15/2012 1207   BUN 9 12/15/2012 1207   CREATININE 0.64 12/15/2012 1207   CREATININE 0.58 09/02/2012 0559   CALCIUM 8.9 12/15/2012 1207   PROT 6.2 12/15/2012 1207   ALBUMIN 3.8 12/15/2012 1207   AST 12 12/15/2012 1207   ALT 10 12/15/2012 1207   ALKPHOS 58 12/15/2012 1207   BILITOT 0.3 12/15/2012 1207   GFRNONAA >89 12/15/2012 1207   GFRNONAA >90 09/02/2012 0559   GFRAA >89 12/15/2012 1207   GFRAA >90 09/02/2012 0559    Lab Results  Component Value  Date/Time   CHOL 161 04/06/2013 11:28 AM    No components found with this basename: hga1c    Lab Results  Component Value Date/Time   AST 12 12/15/2012 12:07 PM    Assessment and Plan  Hypothyroidism - Plan: Continue with her levothyroxine 100 mcg daily, will repeat TSH  URI (upper respiratory infection) - Plan: azithromycin (ZITHROMAX Z-PAK) 250 MG tablet  Cough - Plan: benzonatate (TESSALON) 100 MG capsule  Nasal congestion - Plan: fluticasone (FLONASE) 50 MCG/ACT nasal spray, also advise patient for saltwater gargles.  Screening - Plan: Ordered baseline blood work. COMPLETE METABOLIC PANEL WITH GFR, Vit D  25 hydroxy (rtn osteoporosis monitoring), MM DIGITAL SCREENING BILATERAL   Health Maintenance  -Mammogram: ordered   Return in about 3 months (around 10/28/2013) for hypothyroid.  Doris Cheadleeepak Shirle Provencal, MD

## 2013-07-28 NOTE — Progress Notes (Signed)
Patient here for follow up on her thyroid Complains of sounding hoarse and having a cough With increased mucous for the past week

## 2013-07-29 LAB — TSH: TSH: 0.996 u[IU]/mL (ref 0.350–4.500)

## 2013-07-29 LAB — VITAMIN D 25 HYDROXY (VIT D DEFICIENCY, FRACTURES): Vit D, 25-Hydroxy: 22 ng/mL — ABNORMAL LOW (ref 30–89)

## 2013-07-31 ENCOUNTER — Telehealth: Payer: Self-pay

## 2013-07-31 ENCOUNTER — Telehealth: Payer: Self-pay | Admitting: *Deleted

## 2013-07-31 MED ORDER — VITAMIN D (ERGOCALCIFEROL) 1.25 MG (50000 UNIT) PO CAPS: 50000.0000 [IU] | ORAL_CAPSULE | ORAL | Status: DC

## 2013-07-31 NOTE — Telephone Encounter (Signed)
Patient returning call. Informed her Blood work reviewed, noticed low vitamin D, call patient advise to start ergocalciferol 50,000 units once a week for the duration of 12 weeks. Informed patient the prescription is at her preferred pharmacy. Informed patient of impaired fasting glucose, call and advise patient for low carbohydrate diet. Patient also informed thatTSH level is in normal range, continue with current dose of thyroid medication. Patient had  A question about low carb diet. Informed patient she can eat meat, fish, eggs, vegetables, fruit, nuts, seeds, high-fat dairy, fats, healthy oils. Patient advised to avoid sugar, high fructose corn syrup, wheat, seed oils, trans fats, artificial sweeteners. Patient verbalized understanding.

## 2013-07-31 NOTE — Telephone Encounter (Signed)
Patient not available Left message on voice mail to return our call 

## 2013-07-31 NOTE — Telephone Encounter (Signed)
Message copied by Lestine MountJUAREZ, Abigael Mogle L on Mon Jul 31, 2013 12:57 PM ------      Message from: Doris CheadleADVANI, DEEPAK      Created: Mon Jul 31, 2013  9:30 AM       Blood work reviewed, noticed low vitamin D, call patient advise to start ergocalciferol 50,000 units once a week for the duration of  12 weeks.       noticed impaired fasting glucose, call and advise patient for low carbohydrate diet.      TSH level is in normal range, continue with current dose of thyroid medication.       ------

## 2013-08-11 ENCOUNTER — Telehealth: Payer: Self-pay | Admitting: Internal Medicine

## 2013-08-11 NOTE — Telephone Encounter (Signed)
Pt needs authorization for thyroid medication refill. Pt uses [facility], [facility] at [facility].  Also pt has question regarding tyroid results.

## 2013-08-14 ENCOUNTER — Other Ambulatory Visit: Payer: Self-pay | Admitting: *Deleted

## 2013-08-14 ENCOUNTER — Telehealth: Payer: Self-pay

## 2013-08-14 DIAGNOSIS — E039 Hypothyroidism, unspecified: Secondary | ICD-10-CM

## 2013-08-14 NOTE — Telephone Encounter (Signed)
Patient not available Left message on voice mail to return our call 

## 2013-08-15 ENCOUNTER — Telehealth: Payer: Self-pay

## 2013-08-15 MED ORDER — LEVOTHYROXINE SODIUM 125 MCG PO TABS: 125.0000 ug | ORAL_TABLET | Freq: Every day | ORAL | Status: DC

## 2013-08-15 NOTE — Telephone Encounter (Signed)
Patient is aware of her lab results\ Refilled thyroid medication and sent to [facility] on [address]

## 2013-08-15 NOTE — Telephone Encounter (Signed)
Pt returning call

## 2013-08-29 ENCOUNTER — Ambulatory Visit: Payer: No Typology Code available for payment source

## 2013-09-14 ENCOUNTER — Ambulatory Visit (HOSPITAL_COMMUNITY)
Admission: RE | Admit: 2013-09-14 | Discharge: 2013-09-14 | Disposition: A | Discharge status: Home / Self Care | Payer: Self-pay | Source: Ambulatory Visit | Attending: Internal Medicine | Admitting: Internal Medicine

## 2013-09-14 DIAGNOSIS — Z139 Encounter for screening, unspecified: Secondary | ICD-10-CM

## 2013-09-19 ENCOUNTER — Ambulatory Visit: Payer: No Typology Code available for payment source | Attending: Internal Medicine

## 2013-09-22 ENCOUNTER — Telehealth: Payer: Self-pay | Admitting: *Deleted

## 2013-09-22 NOTE — Telephone Encounter (Signed)
Patient calling for mammogram results. Informed patient mammogram was negative and she will need another mammogram in one year.

## 2013-10-26 ENCOUNTER — Encounter: Payer: Self-pay | Admitting: Internal Medicine

## 2013-10-26 ENCOUNTER — Ambulatory Visit: Payer: No Typology Code available for payment source | Attending: Internal Medicine | Admitting: Internal Medicine

## 2013-10-26 VITALS — BP 126/85 | HR 87 | Temp 98.5°F | Resp 18 | Wt 259.0 lb

## 2013-10-26 DIAGNOSIS — E038 Other specified hypothyroidism: Secondary | ICD-10-CM

## 2013-10-26 DIAGNOSIS — R202 Paresthesia of skin: Secondary | ICD-10-CM | POA: Insufficient documentation

## 2013-10-26 DIAGNOSIS — E559 Vitamin D deficiency, unspecified: Secondary | ICD-10-CM

## 2013-10-26 DIAGNOSIS — E039 Hypothyroidism, unspecified: Secondary | ICD-10-CM | POA: Insufficient documentation

## 2013-10-26 DIAGNOSIS — Z833 Family history of diabetes mellitus: Secondary | ICD-10-CM | POA: Insufficient documentation

## 2013-10-26 DIAGNOSIS — R7301 Impaired fasting glucose: Secondary | ICD-10-CM | POA: Insufficient documentation

## 2013-10-26 DIAGNOSIS — R209 Unspecified disturbances of skin sensation: Secondary | ICD-10-CM

## 2013-10-26 HISTORY — DX: Vitamin D deficiency, unspecified: E55.9

## 2013-10-26 NOTE — Patient Instructions (Signed)
Diabetes Mellitus and Food It is important for you to manage your blood sugar (glucose) level. Your blood glucose level can be greatly affected by what you eat. Eating healthier foods in the appropriate amounts throughout the day at about the same time each day will help you control your blood glucose level. It can also help slow or prevent worsening of your diabetes mellitus. Healthy eating may even help you improve the level of your blood pressure and reach or maintain a healthy weight.  HOW CAN FOOD AFFECT ME? Carbohydrates Carbohydrates affect your blood glucose level more than any other type of food. Your dietitian will help you determine how many carbohydrates to eat at each meal and teach you how to count carbohydrates. Counting carbohydrates is important to keep your blood glucose at a healthy level, especially if you are using insulin or taking certain medicines for diabetes mellitus. Alcohol Alcohol can cause sudden decreases in blood glucose (hypoglycemia), especially if you use insulin or take certain medicines for diabetes mellitus. Hypoglycemia can be a life-threatening condition. Symptoms of hypoglycemia (sleepiness, dizziness, and disorientation) are similar to symptoms of having too much alcohol.  If your health care provider has given you approval to drink alcohol, do so in moderation and use the following guidelines:  Women should not have more than one drink per day, and men should not have more than two drinks per day. One drink is equal to:  12 oz of beer.  5 oz of wine.  1 oz of hard liquor.  Do not drink on an empty stomach.  Keep yourself hydrated. Have water, diet soda, or unsweetened iced tea.  Regular soda, juice, and other mixers might contain a lot of carbohydrates and should be counted. WHAT FOODS ARE NOT RECOMMENDED? As you make food choices, it is important to remember that all foods are not the same. Some foods have fewer nutrients per serving than other  foods, even though they might have the same number of calories or carbohydrates. It is difficult to get your body what it needs when you eat foods with fewer nutrients. Examples of foods that you should avoid that are high in calories and carbohydrates but low in nutrients include:  Trans fats (most processed foods list trans fats on the Nutrition Facts label).  Regular soda.  Juice.  Candy.  Sweets, such as cake, pie, doughnuts, and cookies.  Fried foods. WHAT FOODS CAN I EAT? Have nutrient-rich foods, which will nourish your body and keep you healthy. The food you should eat also will depend on several factors, including:  The calories you need.  The medicines you take.  Your weight.  Your blood glucose level.  Your blood pressure level.  Your cholesterol level. You also should eat a variety of foods, including:  Protein, such as meat, poultry, fish, tofu, nuts, and seeds (lean animal proteins are best).  Fruits.  Vegetables.  Dairy products, such as milk, cheese, and yogurt (low fat is best).  Breads, grains, pasta, cereal, rice, and beans.  Fats such as olive oil, trans fat-free margarine, canola oil, avocado, and olives. DOES EVERYONE WITH DIABETES MELLITUS HAVE THE SAME MEAL PLAN? Because every person with diabetes mellitus is different, there is not one meal plan that works for everyone. It is very important that you meet with a dietitian who will help you create a meal plan that is just right for you. Document Released: 11/27/2004 Document Revised: 03/07/2013 Document Reviewed: 01/27/2013 [employer] Patient Information 2015 [employer]. This   information is not intended to replace advice given to you by your health care provider. Make sure you discuss any questions you have with your health care provider.  

## 2013-10-26 NOTE — Progress Notes (Signed)
MRN: [id number] Name: Kristi BambergJennifer Ortiz  Sex: female Age: 43 y.o. DOB: 03-Mar-1971  Allergies: Mold extract  Chief Complaint  Patient presents with  . Follow-up    hypothyroidism    HPI: Patient is 43 y.o. female who has history of hypothyroidism comes today for followup, recently had blood work done which was reviewed with the patient noticed impaired fasting glucose, she does report family history of diabetes, also noticed vitamin D deficiency which as per patient she is taking the supplement, she reported to have chronic numbness tingling in hands, she had an MRI brain done last year which was reported to be normal. Denies any weakness.  Past Medical History  Diagnosis Date  . Asthma   . Hypothyroid   . Sleep apnea     Past Surgical History  Procedure Laterality Date  . Dental surgery        Medication List       This list is accurate as of: 10/26/13 11:52 AM.  Always use your most recent med list.               azithromycin 250 MG tablet  Commonly known as:  ZITHROMAX Z-PAK  Take as directed     benzonatate 100 MG capsule  Commonly known as:  TESSALON  Take 1 capsule (100 mg total) by mouth 3 (three) times daily as needed for cough.     cetirizine 10 MG chewable tablet  Commonly known as:  ZYRTEC  Chew 10 mg by mouth daily.     fluticasone 50 MCG/ACT nasal spray  Commonly known as:  FLONASE  Place 2 sprays into both nostrils daily.     ibuprofen 200 MG tablet  Commonly known as:  ADVIL,MOTRIN  Take 200-400 mg by mouth every 6 (six) hours as needed for pain.     levothyroxine 125 MCG tablet  Commonly known as:  SYNTHROID, LEVOTHROID  Take 1 tablet (125 mcg total) by mouth daily before breakfast.     Vitamin D (Ergocalciferol) 50000 UNITS Caps capsule  Commonly known as:  DRISDOL  Take 1 capsule (50,000 Units total) by mouth every 7 (seven) days.        No orders of the defined types were placed in this encounter.    Immunization History    Administered Date(s) Administered  . Influenza Split 12/15/2012  . PPD Test 02/28/2013    Family History  Problem Relation Age of Onset  . Diabetes Mother     History  Substance Use Topics  . Smoking status: Never Smoker   . Smokeless tobacco: Never Used  . Alcohol Use: No    Review of Systems   As noted in HPI  Filed Vitals:   10/26/13 1117  BP: 126/85  Pulse: 87  Temp: 98.5 F (36.9 C)  Resp: 18    Physical Exam  Physical Exam  Constitutional: No distress.  Eyes: EOM are normal. Pupils are equal, round, and reactive to light.  Cardiovascular: Normal rate and regular rhythm.   Pulmonary/Chest: Breath sounds normal. No respiratory distress. She has no wheezes. She has no rales.  Musculoskeletal: She exhibits no edema.    CBC    Component Value Date/Time   WBC 8.3 12/15/2012 1207   RBC 4.81 12/15/2012 1207   HGB 13.2 12/15/2012 1207   HCT 39.1 12/15/2012 1207   PLT 331 12/15/2012 1207   MCV 81.3 12/15/2012 1207   LYMPHSABS 1.8 09/01/2012 1415   MONOABS 0.6 09/01/2012 1415   EOSABS  0.3 09/01/2012 1415   BASOSABS 0.0 09/01/2012 1415    CMP     Component Value Date/Time   NA 138 07/28/2013 1448   K 4.2 07/28/2013 1448   CL 103 07/28/2013 1448   CO2 23 07/28/2013 1448   GLUCOSE 107* 07/28/2013 1448   BUN 11 07/28/2013 1448   CREATININE 0.57 07/28/2013 1448   CREATININE 0.58 09/02/2012 0559   CALCIUM 9.1 07/28/2013 1448   PROT 6.3 07/28/2013 1448   ALBUMIN 4.0 07/28/2013 1448   AST 14 07/28/2013 1448   ALT 15 07/28/2013 1448   ALKPHOS 65 07/28/2013 1448   BILITOT 0.4 07/28/2013 1448   GFRNONAA >89 07/28/2013 1448   GFRNONAA >90 09/02/2012 0559   GFRAA >89 07/28/2013 1448   GFRAA >90 09/02/2012 0559    Lab Results  Component Value Date/Time   CHOL 161 04/06/2013 11:28 AM    No components found with this basename: hga1c    Lab Results  Component Value Date/Time   AST 14 07/28/2013  2:48 PM    Assessment and Plan  Other specified hypothyroidism Recent TSH  level is in normal range continue with levothyroxine 125 mcg daily.  IFG (impaired fasting glucose) Advised patient for low carbohydrate diet.   Unspecified vitamin D deficiency Continue with Vitamin D supplement.  Paresthesia of both hands - Plan: Ambulatory referral to Neurology  Return in about 3 months (around 01/26/2014) for hypothyroid.  Doris Cheadle, MD

## 2013-10-26 NOTE — Progress Notes (Signed)
Pt is here for f/u on hypothyroidism Also c/o intermittent numbness of hands and leg cramping Would like a referral to Neuro Ambulated well to exam room w/NAD Alert; no signs of acute distress.

## 2013-12-08 ENCOUNTER — Ambulatory Visit (INDEPENDENT_AMBULATORY_CARE_PROVIDER_SITE_OTHER): Payer: Self-pay | Admitting: Neurology

## 2013-12-08 ENCOUNTER — Encounter: Payer: Self-pay | Admitting: Neurology

## 2013-12-08 VITALS — BP 110/80 | HR 80 | Ht 65.0 in | Wt 259.1 lb

## 2013-12-08 DIAGNOSIS — R209 Unspecified disturbances of skin sensation: Secondary | ICD-10-CM

## 2013-12-08 DIAGNOSIS — R413 Other amnesia: Secondary | ICD-10-CM

## 2013-12-08 DIAGNOSIS — G5602 Carpal tunnel syndrome, left upper limb: Secondary | ICD-10-CM

## 2013-12-08 DIAGNOSIS — G5600 Carpal tunnel syndrome, unspecified upper limb: Secondary | ICD-10-CM

## 2013-12-08 DIAGNOSIS — R252 Cramp and spasm: Secondary | ICD-10-CM

## 2013-12-08 NOTE — Patient Instructions (Signed)
1.  Check blood work today 2.  For muscle cramps you can take magnesium oxide 400 mg po every day 3.  You can try using a wrist splint if you hands become bothersome 4.  Please follow-up with your primary care doctor for sleep apnea and panic attacks 5.  Telephone update with results

## 2013-12-08 NOTE — Progress Notes (Signed)
[facility] Clinic Note - Initial Visit   Date: 12/08/2013  [name] MRN: [id number] DOB: Nov 26, 1970   Dear Dr. Orpah Cobb:  Thank you for your kind referral of Kristi Ortiz for consultation of numbness/tingling of the hands. Although her history is well known to you, please allow [place] to reiterate it for the purpose of our medical record. The patient was accompanied to the clinic by self.    History of Present Illness: Kristi Ortiz is a 43 y.o. left-handed Caucasian female with history of hypothyroidism, OSA, and asthma referred for evaluation of numbness/tingling of the hands, but she has a multitude of complaints including memory loss and cramps.  Patient is a poor historian.  She is referred for bilateral paresthesias, but she complains of memory problems and is specifically asking to find "what damage has sleep apnea done to my heart and brain?"  She was diagnosed with OSA several years ago and is not using CPAP because of "my neighborhood problems".  She seems to have the equipment at some point in time, but says that "roaches got it, so can't get another one".  It is very difficult to get a clear history from her, despite asking very direct questions.     Regarding her hand symptoms, around 2014 she developed numbness/tingling of the hands which is intermittent.  She has associated soreness of the elbows and upper arms at times.  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  No exacerbating or alleviatig factors.  Denies any neck pain.    She complains of bilateral leg cramps and soreness, which she noticed after going on a short hike with her parents last week.  On a side note, she tells me that she is seeing behaviorial therapy because of panic attacks.  Out-side paper records, electronic medical record, and images have been reviewed where available and summarized as:  MRI  brain wo contrast 09/02/2013:  Unremarkable cranial MRI. No acute or focal intracranial findings. No generalized or focal cerebral volume loss. No evidence for white matter disease or chronic hemorrhage.  Lab Results  Component Value Date   TSH 0.996 07/28/2013   Lab Results  Component Value Date   VITAMINB12 340 09/02/2012       Past Medical History  Diagnosis Date  . Asthma   . Hypothyroid   . Sleep apnea     Past Surgical History  Procedure Laterality Date  . Dental surgery       Medications:  Current Outpatient Prescriptions on File Prior to Visit  Medication Sig Dispense Refill  . benzonatate (TESSALON) 100 MG capsule Take 1 capsule (100 mg total) by mouth 3 (three) times daily as needed for cough.  30 capsule  1  . cetirizine (ZYRTEC) 10 MG chewable tablet Chew 10 mg by mouth daily.      . fluticasone (FLONASE) 50 MCG/ACT nasal spray Place 2 sprays into both nostrils daily.  16 g  6  . ibuprofen (ADVIL,MOTRIN) 200 MG tablet Take 200-400 mg by mouth every 6 (six) hours as needed for pain.      Marland Kitchen levothyroxine (SYNTHROID, LEVOTHROID) 125 MCG tablet Take 1 tablet (125 mcg total) by mouth daily before breakfast.  30 tablet  4  . Vitamin D, Ergocalciferol, (DRISDOL) 50000 UNITS CAPS capsule Take 1 capsule (50,000 Units total) by mouth every 7 (seven) days.  12 capsule  0   No current facility-administered medications on file  prior to visit.    Allergies:  Allergies  Allergen Reactions  . Mold Extract [Trichophyton]     Cold, asthma, occurs in spells    Family History: Family History  Problem Relation Age of Onset  . Diabetes Mother     Social History: History   Social History  . Marital Status: Single    Spouse Name: N/A    Number of Children: N/A  . Years of Education: N/A   Occupational History  . Not on file.   Social History Main Topics  . Smoking status: Never Smoker   . Smokeless tobacco: Never Used  . Alcohol Use: No  . Drug Use: No  . Sexual  Activity: Not on file   Other Topics Concern  . Not on file   Social History Narrative  . No narrative on file    Review of Systems:  CONSTITUTIONAL: No fevers, chills, night sweats, or weight loss.   EYES: No visual changes or eye pain ENT: No hearing changes.  No history of nose bleeds.   RESPIRATORY: No cough, wheezing and shortness of breath.   CARDIOVASCULAR: Negative for chest pain, and palpitations.   GI: Negative for abdominal discomfort, blood in stools or black stools.  No recent change in bowel habits.   GU:  No history of incontinence.   MUSCLOSKELETAL: No history of joint pain or swelling.  +myalgias.   SKIN: Negative for lesions, rash, and itching.   HEMATOLOGY/ONCOLOGY: Negative for prolonged bleeding, bruising easily, and swollen nodes.  No history of cancer.   ENDOCRINE: Negative for cold or heat intolerance, polydipsia or goiter.   PSYCH:  ++depression or anxiety symptoms.   NEURO: As Above.   Vital Signs:  BP 110/80  Pulse 80  Ht  (1.651 m)  Wt 259 lb 2 oz (117.538 kg)  BMI 43.12 kg/m2  SpO2 96%   General Medical Exam:   General:  Poor personal hygiene, slightly disheveled appearing, comfortable.   Eyes/ENT: see cranial nerve examination.   Neck: No masses appreciated.  Full range of motion without tenderness.  No carotid bruits. Respiratory:  Clear to auscultation, good air entry bilaterally.   Cardiac:  Regular rate and rhythm, no murmur.    Extremities:  No deformities, edema, or skin discoloration. Good capillary refill.   Skin:  Skin color, texture, turgor normal. No rashes or lesions.  Neurological Exam: Montreal Cognitive Assessment  12/08/2013  Visuospatial/ Executive (0/5) 5  Naming (0/3) 3  Attention: Read list of digits (0/2) 2  Attention: Read list of letters (0/1) 1  Attention: Serial 7 subtraction starting at 100 (0/3) 3  Language: Repeat phrase (0/2) 2  Language : Fluency (0/1) 0  Abstraction (0/2) 2  Delayed Recall (0/5) 4    Orientation (0/6) 6  Total 28  Adjusted Score (based on education) 29    MENTAL STATUS including orientation to time, place, person, recent and remote memory, attention span and concentration, language, and fund of knowledge is normal.  Thought process is tangential and she is difficult to reorient at times.  Speech is not dysarthric, but demonstrates stuttering.  CRANIAL NERVES: II:  No visual field defects.  Unremarkable fundi.   III-IV-VI: Pupils equal round and reactive to light.  Normal conjugate, extra-ocular eye movements in all directions of gaze.  No nystagmus.  No ptosis.   V:  Normal facial sensation.     VII:  Normal facial symmetry and movements.   VIII:  Normal hearing and vestibular function.  IX-X:  Normal palatal movement.   XI:  Normal shoulder shrug and head rotation.   XII:  Normal tongue strength and range of motion, no deviation or fasciculation.  MOTOR:  Motor strength is 5/5, including distal hand and feet muscles.  No atrophy, fasciculations or abnormal movements.  No pronator drift.  Tone is normal.  Tinel's positive on left.  MSRs:  Right                                                                 Left brachioradialis 2+  brachioradialis 2+  biceps 2+  biceps 2+  triceps 2+  triceps 2+  patellar 2+  patellar 2+  ankle jerk 2+  ankle jerk 2+  Hoffman no  Hoffman no  plantar response down  plantar response down   SENSORY:  Normal and symmetric perception of light touch, pinprick, vibration, and proprioception.  Romberg's sign absent.   COORDINATION/GAIT: Normal finger-to- nose-finger and heel-to-shin.  Intact rapid alternating movements bilaterally.  Able to rise from a chair without using arms.  Gait narrow based and stable. Tandem and stressed gait intact.    IMPRESSION: Ms. Lun is a 43 year-old female referred for evaluation of bilateral hand paresthesias.  She may have mild carpal tunnel syndrome on the left for which I recommended using a  wrist splint.  NCS/EMG was discussed and recommended, but patient declined, stating that symptoms are not too bothersome.  She is more concerned about the complications of untreated OSA to her cognition at today's visit.  I performed MOCA testing and she performed exceedingly well scoring 29/30.  I reassured her that with a normal exam and excellent cognitive screening testing, she does not have any permanent injury to her brain.  More than likely, she has cognitive slowing from chronic fatigue secondary to untreated OSA as well as underlying mood disorder.    For her fatigue and memory changes, I will screen for thyroid and B12 deficiency.  Because of her muscle cramps, Mg will be checked.    PLAN/RECOMMENDATIONS:  1.  Check TSH, vitamin B12, Mg 2.  For muscle cramps you can take magnesium oxide 400 mg po every day, perform leg stretches, and stay well hydrated 3.  Recommend using a wrist splint if you hands become bothersome 4.  Patient not interested in NCS/EMG 5.  Follow-up with your primary care doctor/sleep specialist for sleep apnea  6.  Telephone update with results    The duration of this appointment visit was 60 minutes of face-to-face time with the patient.  Greater than 50% of this time was spent in counseling, explanation of diagnosis, planning of further management, and coordination of care.   Thank you for allowing me to participate in patient's care.  If I can answer any additional questions, I would be pleased to do so.    Sincerely,    Donika K. Allena Katz, DO

## 2013-12-18 LAB — TSH: TSH: 2.362 u[IU]/mL (ref 0.350–4.500)

## 2013-12-18 LAB — MAGNESIUM: MAGNESIUM: 2.1 mg/dL (ref 1.5–2.5)

## 2013-12-18 LAB — VITAMIN B12: VITAMIN B 12: 403 pg/mL (ref 211–911)

## 2013-12-29 ENCOUNTER — Other Ambulatory Visit: Payer: Self-pay

## 2014-01-17 ENCOUNTER — Telehealth: Payer: Self-pay | Admitting: Internal Medicine

## 2014-01-17 NOTE — Telephone Encounter (Signed)
Patient calling to request medication refill for inhaler. Please assist.

## 2014-01-23 ENCOUNTER — Ambulatory Visit: Payer: Self-pay | Admitting: Internal Medicine

## 2014-01-30 ENCOUNTER — Other Ambulatory Visit: Payer: Self-pay | Admitting: Emergency Medicine

## 2014-01-30 MED ORDER — ALBUTEROL SULFATE HFA 108 (90 BASE) MCG/ACT IN AERS: 2.0000 | INHALATION_SPRAY | Freq: Four times a day (QID) | RESPIRATORY_TRACT | Status: DC | PRN

## 2014-02-07 ENCOUNTER — Encounter: Payer: Self-pay | Admitting: Family Medicine

## 2014-02-07 ENCOUNTER — Ambulatory Visit: Payer: No Typology Code available for payment source | Attending: Family Medicine | Admitting: Family Medicine

## 2014-02-07 ENCOUNTER — Ambulatory Visit (HOSPITAL_BASED_OUTPATIENT_CLINIC_OR_DEPARTMENT_OTHER): Payer: No Typology Code available for payment source | Admitting: *Deleted

## 2014-02-07 VITALS — BP 119/78 | HR 73 | Temp 97.4°F | Resp 18 | Ht 65.0 in | Wt 258.0 lb

## 2014-02-07 DIAGNOSIS — E559 Vitamin D deficiency, unspecified: Secondary | ICD-10-CM

## 2014-02-07 DIAGNOSIS — Z113 Encounter for screening for infections with a predominantly sexual mode of transmission: Secondary | ICD-10-CM

## 2014-02-07 DIAGNOSIS — E039 Hypothyroidism, unspecified: Secondary | ICD-10-CM

## 2014-02-07 DIAGNOSIS — J4530 Mild persistent asthma, uncomplicated: Secondary | ICD-10-CM

## 2014-02-07 DIAGNOSIS — J4531 Mild persistent asthma with (acute) exacerbation: Secondary | ICD-10-CM

## 2014-02-07 DIAGNOSIS — Z23 Encounter for immunization: Secondary | ICD-10-CM

## 2014-02-07 LAB — TSH: TSH: 1.742 u[IU]/mL (ref 0.350–4.500)

## 2014-02-07 LAB — HIV ANTIBODY (ROUTINE TESTING W REFLEX): HIV: NONREACTIVE

## 2014-02-07 MED ORDER — ALBUTEROL SULFATE HFA 108 (90 BASE) MCG/ACT IN AERS: 2.0000 | INHALATION_SPRAY | Freq: Four times a day (QID) | RESPIRATORY_TRACT | Status: DC | PRN

## 2014-02-07 NOTE — Assessment & Plan Note (Addendum)
Screening HIV  Screening HIV negative  

## 2014-02-07 NOTE — Assessment & Plan Note (Signed)
A: normal TSH from last month P: Repeat TSH Refill synthroid once TSH reviewed

## 2014-02-07 NOTE — Assessment & Plan Note (Signed)
Refilled albuterol 

## 2014-02-07 NOTE — Progress Notes (Signed)
   Subjective:    Patient ID: Kristi Ortiz, female    DOB: [birth year], 43 y.o.   MRN: [id number] CC: f/u hypothyroidism  HPI 43 yo F presents for f/u hypothyroidism:  1. Hypothyroidism: taking synthroid every AM. No palpitations or fatigue. Some SOB intermittent. No CP.  2. Asthma: has history of asthma. No recent exacerbations. No longer has albuterol.   Soc Hx:  Chronic non smoker  Review of Systems As per HPI     Objective:   Physical Exam BP 119/78 mmHg  Pulse 73  Temp(Src) 97.4 F (36.3 C) (Oral)  Resp 18  Ht 5\' 5"  (1.651 m)  Wt 258 lb (117.028 kg)  BMI 42.93 kg/m2  SpO2 96%  LMP 12/14/2013 (Exact Date) General appearance: alert, cooperative, no distress and morbidly obese Neck: thyroid not enlarged, symmetric, no tenderness/mass/nodules Lungs: clear to auscultation bilaterally Heart: regular rate and rhythm, S1, S2 normal, no murmur, click, rub or gallop Extremities: edema 1+ b/l       Assessment & Plan:

## 2014-02-07 NOTE — Progress Notes (Signed)
F/U Thyroid  Medication Refills

## 2014-02-07 NOTE — Patient Instructions (Addendum)
Ms. Kristi Ortiz,  Thank you for coming in today. It was a pleasure meeting you. I look forward to being your primary doctor.  You will be called with lab results and dose change of synthroid if needed.  I have sent a refill for albuterol to the pharmacy here.   F/u in 3 months  Dr. Armen PickupFunches

## 2014-02-08 LAB — VITAMIN D 25 HYDROXY (VIT D DEFICIENCY, FRACTURES): Vit D, 25-Hydroxy: 20 ng/mL — ABNORMAL LOW (ref 30–100)

## 2014-02-12 ENCOUNTER — Ambulatory Visit: Payer: No Typology Code available for payment source | Attending: Internal Medicine

## 2014-02-12 ENCOUNTER — Telehealth: Payer: Self-pay | Admitting: Family Medicine

## 2014-02-12 NOTE — Telephone Encounter (Signed)
Returned pt call. Left message to return call for assistant.

## 2014-02-12 NOTE — Telephone Encounter (Signed)
Pt had appt on 02/07/14 and says she was confused about blood work results but says she wanted to confirm that she indeed have blood work taken at [facility].    Also pt would like talk to clinician about scripts for Clarinex and Nasonex. Please f/u with pt.

## 2014-02-14 ENCOUNTER — Other Ambulatory Visit: Payer: Self-pay | Admitting: Internal Medicine

## 2014-02-14 DIAGNOSIS — J4531 Mild persistent asthma with (acute) exacerbation: Secondary | ICD-10-CM

## 2014-02-14 MED ORDER — LEVOTHYROXINE SODIUM 125 MCG PO TABS: 125.0000 ug | ORAL_TABLET | Freq: Every day | ORAL | Status: DC

## 2014-02-14 MED ORDER — ALBUTEROL SULFATE HFA 108 (90 BASE) MCG/ACT IN AERS: 2.0000 | INHALATION_SPRAY | Freq: Four times a day (QID) | RESPIRATORY_TRACT | Status: DC | PRN

## 2014-02-14 MED ORDER — VITAMIN D3 10 MCG (400 UNIT) PO TABS: 800.0000 [IU] | ORAL_TABLET | Freq: Every day | ORAL | Status: DC

## 2014-02-14 NOTE — Telephone Encounter (Signed)
Pt has question about medication, please f/u with pt.  °

## 2014-02-14 NOTE — Assessment & Plan Note (Signed)
Still with vit D insufficiency, sent in refill of Vit D Vit D insufficiency. Recommend supplement with 800 units  of Vit D3 supplement daily. Also be mindful to get 1000 mg of calcium in diet or supplement form daily.

## 2014-02-14 NOTE — Addendum Note (Signed)
Addended by: Dessa PhiFUNCHES, Tovia Kisner on: 02/14/2014 01:49 PM   Modules accepted: Orders

## 2014-02-20 ENCOUNTER — Telehealth: Payer: Self-pay | Admitting: Emergency Medicine

## 2014-02-20 NOTE — Telephone Encounter (Signed)
Left message for pt to call clinic when message received 

## 2014-02-20 NOTE — Telephone Encounter (Signed)
Pt given lab results and medication instructions

## 2014-06-21 ENCOUNTER — Ambulatory Visit: Payer: Self-pay

## 2015-03-04 ENCOUNTER — Telehealth (HOSPITAL_COMMUNITY): Payer: Self-pay | Admitting: *Deleted

## 2015-03-04 NOTE — Telephone Encounter (Signed)
Telephoned patient at home # and left message to return call to [facility] 

## 2015-03-30 ENCOUNTER — Ambulatory Visit (INDEPENDENT_AMBULATORY_CARE_PROVIDER_SITE_OTHER): Payer: BLUE CROSS/BLUE SHIELD | Admitting: Emergency Medicine

## 2015-03-30 VITALS — BP 130/82 | HR 87 | Temp 97.9°F | Resp 20 | Ht 65.0 in | Wt 244.0 lb

## 2015-03-30 DIAGNOSIS — J209 Acute bronchitis, unspecified: Secondary | ICD-10-CM | POA: Diagnosis not present

## 2015-03-30 DIAGNOSIS — J0140 Acute pansinusitis, unspecified: Secondary | ICD-10-CM

## 2015-03-30 MED ORDER — ALBUTEROL SULFATE HFA 108 (90 BASE) MCG/ACT IN AERS: 2.0000 | INHALATION_SPRAY | RESPIRATORY_TRACT | Status: DC | PRN

## 2015-03-30 MED ORDER — PSEUDOEPHEDRINE-GUAIFENESIN ER 60-600 MG PO TB12
1.0000 | ORAL_TABLET | Freq: Two times a day (BID) | ORAL | Status: DC
Start: 2015-03-30 — End: 2015-04-09

## 2015-03-30 MED ORDER — HYDROCOD POLST-CPM POLST ER 10-8 MG/5ML PO SUER: 5.0000 mL | Freq: Two times a day (BID) | ORAL | Status: DC

## 2015-03-30 MED ORDER — AMOXICILLIN-POT CLAVULANATE 875-125 MG PO TABS: 1.0000 | ORAL_TABLET | Freq: Two times a day (BID) | ORAL | Status: DC

## 2015-03-30 NOTE — Progress Notes (Signed)
Subjective:  Patient ID: Kristi Ortiz, female    DOB: Feb 02, 1971  Age: 45 y.o. MRN: [id number]  CC: Cough; Sinusitis; and Shortness of Breath   HPI Kristi BambergJennifer Ortiz presents   Patient has a history of asthma. She has nasal congestion and postnasal drainage purulent character.. She has pressure in her cheeks. Has postnasal drainage. She has sore throat. She has a cough productive of purulent sputum and wheezing and shortness of breath. She has no nausea vomiting or stool change back. No rash has no improvement with over-the-counter medication shoe. She's not able sleep due to the cough. She's run out of her inhaler.  History Kristi Ortiz has a past medical history of Asthma (Dx1990); Sleep apnea (DX 2012); Hypothyroid (Dx 1994); Allergy; Depression; Sleep apnea; and Sleep apnea.   She has past surgical history that includes Dental surgery.   Her  family history includes Diabetes in her mother; Healthy in her brother and sister; Hypertension in her mother; Psoriasis in her father.  She   reports that she has never smoked. She has never used smokeless tobacco. She reports that she does not drink alcohol or use illicit drugs.  Outpatient Prescriptions Prior to Visit  Medication Sig Dispense Refill  . albuterol (PROVENTIL HFA;VENTOLIN HFA) 108 (90 BASE) MCG/ACT inhaler Inhale 2 puffs into the lungs every 6 (six) hours as needed for wheezing or shortness of breath. 3 Inhaler 3  . cetirizine (ZYRTEC) 10 MG chewable tablet Chew 10 mg by mouth daily.    . fluticasone (FLONASE) 50 MCG/ACT nasal spray Place 2 sprays into both nostrils daily. 16 g 6  . levothyroxine (SYNTHROID, LEVOTHROID) 125 MCG tablet Take 1 tablet (125 mcg total) by mouth daily before breakfast. 90 tablet 1  . Cholecalciferol (VITAMIN D3) 400 UNITS tablet Take 2 tablets (800 Units total) by mouth daily. (Patient not taking: Reported on 03/30/2015) 180 tablet 2  . ibuprofen (ADVIL,MOTRIN) 200 MG tablet Take 200-400 mg by mouth  every 6 (six) hours as needed for pain. Reported on 03/30/2015    . Vitamin D, Ergocalciferol, (DRISDOL) 50000 UNITS CAPS capsule Take 1 capsule (50,000 Units total) by mouth every 7 (seven) days. (Patient not taking: Reported on 03/30/2015) 12 capsule 0   No facility-administered medications prior to visit.    Social History   Social History  . Marital Status: Single    Spouse Name: N/A  . Number of Children: N/A  . Years of Education: N/A   Social History Main Topics  . Smoking status: Never Smoker   . Smokeless tobacco: Never Used  . Alcohol Use: No  . Drug Use: No  . Sexual Activity: Not Asked   Other Topics Concern  . None   Social History Narrative   She lives with parents.   She is currently unemployed and has been working on a manuscript (fiction).  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   She is going to [employer] in childhood education.              Review of Systems  Constitutional: Positive for fever and chills. Negative for appetite change.  HENT: Positive for congestion, postnasal drip, rhinorrhea, sinus pressure and sore throat. Negative for ear pain.   Eyes: Negative for pain and redness.  Respiratory: Positive for cough and shortness of breath. Negative for wheezing.   Cardiovascular: Negative for leg swelling.  Gastrointestinal: Negative for nausea, vomiting, abdominal pain, diarrhea, constipation and blood in stool.  Endocrine: Negative  for polyuria.  Genitourinary: Negative for dysuria, urgency, frequency and flank pain.  Musculoskeletal: Negative for gait problem.  Skin: Negative for rash.  Neurological: Negative for weakness and headaches.  Psychiatric/Behavioral: Negative for confusion and decreased concentration. The patient is not nervous/anxious.     Objective:  BP 130/82 mmHg  Pulse 87  Temp(Src) 97.9 F (36.6 C) (Oral)  Resp 20  Ht 5\' 5"  (1.651 m)  Wt 244 lb (110.678 kg)  BMI 40.60 kg/m2  SpO2 98%   LMP 03/25/2015  Physical Exam  Constitutional: She is oriented to person, place, and time. She appears well-developed and well-nourished. No distress.  HENT:  Head: Normocephalic and atraumatic.  Right Ear: External ear normal.  Left Ear: External ear normal.  Nose: Nose normal.  Eyes: Conjunctivae and EOM are normal. Pupils are equal, round, and reactive to light. No scleral icterus.  Neck: Normal range of motion. Neck supple. No tracheal deviation present.  Cardiovascular: Normal rate, regular rhythm and normal heart sounds.   Pulmonary/Chest: Effort normal. No respiratory distress. She has no wheezes. She has no rales.  Abdominal: She exhibits no mass. There is no tenderness. There is no rebound and no guarding.  Musculoskeletal: She exhibits no edema.  Lymphadenopathy:    She has no cervical adenopathy.  Neurological: She is alert and oriented to person, place, and time. Coordination normal.  Skin: Skin is warm and dry. No rash noted.  Psychiatric: She has a normal mood and affect. Her behavior is normal.      Assessment & Plan:   Gratia was seen today for cough, sinusitis and shortness of breath.  Diagnoses and all orders for this visit:  Acute bronchitis, unspecified organism  Acute pansinusitis, recurrence not specified  Other orders -     amoxicillin-clavulanate (AUGMENTIN) 875-125 MG tablet; Take 1 tablet by mouth 2 (two) times daily. -     chlorpheniramine-HYDROcodone (TUSSIONEX PENNKINETIC ER) 10-8 MG/5ML SUER; Take 5 mLs by mouth 2 (two) times daily. -     pseudoephedrine-guaifenesin (MUCINEX D) 60-600 MG 12 hr tablet; Take 1 tablet by mouth every 12 (twelve) hours. -     albuterol (PROVENTIL HFA;VENTOLIN HFA) 108 (90 Base) MCG/ACT inhaler; Inhale 2 puffs into the lungs every 4 (four) hours as needed for wheezing or shortness of breath (cough, shortness of breath or wheezing.).  I am having Ms. Rucci start on amoxicillin-clavulanate, chlorpheniramine-HYDROcodone,  pseudoephedrine-guaifenesin, and albuterol. I am also having her maintain her ibuprofen, cetirizine, fluticasone, Vitamin D (Ergocalciferol), albuterol, levothyroxine, Vitamin D3, hydrochlorothiazide, QUEtiapine, Melatonin, and metFORMIN.  Meds ordered this encounter  Medications  . hydrochlorothiazide (MICROZIDE) 12.5 MG capsule    Sig: Take 12.5 mg by mouth daily.  . QUEtiapine (SEROQUEL XR) 50 MG TB24 24 hr tablet    Sig: Take 50 mg by mouth.  . Melatonin 3 MG CAPS    Sig: Take by mouth.  . metFORMIN (GLUMETZA) 500 MG (MOD) 24 hr tablet    Sig: Take 500 mg by mouth daily with breakfast.  . amoxicillin-clavulanate (AUGMENTIN) 875-125 MG tablet    Sig: Take 1 tablet by mouth 2 (two) times daily.    Dispense:  20 tablet    Refill:  0  . chlorpheniramine-HYDROcodone (TUSSIONEX PENNKINETIC ER) 10-8 MG/5ML SUER    Sig: Take 5 mLs by mouth 2 (two) times daily.    Dispense:  60 mL    Refill:  0  . pseudoephedrine-guaifenesin (MUCINEX D) 60-600 MG 12 hr tablet    Sig: Take 1 tablet  by mouth every 12 (twelve) hours.    Dispense:  18 tablet    Refill:  0  . albuterol (PROVENTIL HFA;VENTOLIN HFA) 108 (90 Base) MCG/ACT inhaler    Sig: Inhale 2 puffs into the lungs every 4 (four) hours as needed for wheezing or shortness of breath (cough, shortness of breath or wheezing.).    Dispense:  1 Inhaler    Refill:  12    Appropriate red flag conditions were discussed with the patient as well as actions that should be taken.  Patient expressed his understanding.  Follow-up: Return if symptoms worsen or fail to improve.  Carmelina Dane, MD

## 2015-03-30 NOTE — Patient Instructions (Signed)

## 2015-03-31 ENCOUNTER — Telehealth: Payer: Self-pay

## 2015-03-31 NOTE — Telephone Encounter (Signed)
PT is req. rx refill on levothyroxine (SYNTHROID, LEVOTHROID) 125 MCG tablet [[id number]]   Pharmacy:  [facility] [[id number][id number] - [place] ([place]), [place] - [address]         [phone] Please call to advise if OV will be req.

## 2015-04-01 NOTE — Telephone Encounter (Signed)
Yes RTC 

## 2015-04-01 NOTE — Telephone Encounter (Signed)
Pt advised.

## 2015-04-09 ENCOUNTER — Telehealth: Payer: Self-pay | Admitting: *Deleted

## 2015-04-09 ENCOUNTER — Ambulatory Visit (INDEPENDENT_AMBULATORY_CARE_PROVIDER_SITE_OTHER): Payer: BLUE CROSS/BLUE SHIELD | Admitting: Physician Assistant

## 2015-04-09 ENCOUNTER — Encounter: Payer: Self-pay | Admitting: Physician Assistant

## 2015-04-09 VITALS — BP 112/79 | HR 83 | Temp 97.9°F | Resp 16 | Ht 65.25 in | Wt 242.6 lb

## 2015-04-09 DIAGNOSIS — R609 Edema, unspecified: Secondary | ICD-10-CM

## 2015-04-09 DIAGNOSIS — J4530 Mild persistent asthma, uncomplicated: Secondary | ICD-10-CM

## 2015-04-09 DIAGNOSIS — J309 Allergic rhinitis, unspecified: Secondary | ICD-10-CM

## 2015-04-09 DIAGNOSIS — I10 Essential (primary) hypertension: Secondary | ICD-10-CM | POA: Diagnosis not present

## 2015-04-09 DIAGNOSIS — E559 Vitamin D deficiency, unspecified: Secondary | ICD-10-CM | POA: Diagnosis not present

## 2015-04-09 DIAGNOSIS — R413 Other amnesia: Secondary | ICD-10-CM | POA: Diagnosis not present

## 2015-04-09 DIAGNOSIS — E039 Hypothyroidism, unspecified: Secondary | ICD-10-CM | POA: Diagnosis not present

## 2015-04-09 LAB — COMPREHENSIVE METABOLIC PANEL
ALT: 14 U/L (ref 6–29)
AST: 14 U/L (ref 10–30)
Albumin: 3.8 g/dL (ref 3.6–5.1)
Alkaline Phosphatase: 61 U/L (ref 33–115)
BUN: 8 mg/dL (ref 7–25)
CHLORIDE: 103 mmol/L (ref 98–110)
CO2: 27 mmol/L (ref 20–31)
CREATININE: 0.48 mg/dL — AB (ref 0.50–1.10)
Calcium: 8.8 mg/dL (ref 8.6–10.2)
Glucose, Bld: 90 mg/dL (ref 65–99)
POTASSIUM: 4 mmol/L (ref 3.5–5.3)
SODIUM: 137 mmol/L (ref 135–146)
TOTAL PROTEIN: 6.5 g/dL (ref 6.1–8.1)
Total Bilirubin: 0.4 mg/dL (ref 0.2–1.2)

## 2015-04-09 LAB — CBC
HCT: 40.6 % (ref 36.0–46.0)
Hemoglobin: 13.5 g/dL (ref 12.0–15.0)
MCH: 28.1 pg (ref 26.0–34.0)
MCHC: 33.3 g/dL (ref 30.0–36.0)
MCV: 84.6 fL (ref 78.0–100.0)
MPV: 9.3 fL (ref 8.6–12.4)
PLATELETS: 327 10*3/uL (ref 150–400)
RBC: 4.8 MIL/uL (ref 3.87–5.11)
RDW: 14.5 % (ref 11.5–15.5)
WBC: 7.4 10*3/uL (ref 4.0–10.5)

## 2015-04-09 LAB — TSH: TSH: 4.497 u[IU]/mL (ref 0.350–4.500)

## 2015-04-09 NOTE — Progress Notes (Signed)
[facility] [address], [place] [address] [phone]  Date:  04/09/2015   Name:  Kristi Ortiz   DOB:  [birth year]   MRN:  [id number]  PCP:  Lora Paula, MD    Chief Complaint: labwork   History of Present Illness:  This is a 45 y.o. female with PMH hypothyroidism, HTN, OSA, allergic rhinitis, vit D def, obesity who is presenting for thyroid check.  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 Drives. Pt has been seen at [facility] in [place]. She states she has been told she can no longer be seen there because of her insurance. Pt has limited insight into her meds and medical conditions. States she has problems with her memory and gets confused easily.  Asthma - Uses albuterol inhaler 5 times a week, uses at work.  Takes zyrtec for allergies. Get OTC.  States she takes hctz for fluid in her legs. States she does not have problems with her bp.  Takes seroquel at night for depression.  States she was taking metformin for too much testosterone and irregular periods. Ran out 1 week ago. States she periods were getting better while on it.  Takes synthroid 125 mg and working well.  She has 2 weeks left of all her medications.  Has been dx'd with vit D def but not taking any supplements.  Review of Systems:  Review of Systems  See HPI  Patient Active Problem List   Diagnosis Date Noted  . Severe obesity (BMI >= 40) (HCC) 02/07/2014  . IFG (impaired fasting glucose) 10/26/2013  . Vitamin D insufficiency 10/26/2013  . Paresthesia of both hands 10/26/2013  . Allergic rhinitis 12/15/2012  . Cerumen impaction 12/15/2012  . Hypothyroidism 09/01/2012  . OSA (obstructive sleep apnea) 09/01/2012  . Memory difficulty 09/01/2012  . Asthma 09/01/2012  . Edema 09/01/2012    Prior to Admission medications   Medication Sig Start Date End Date Taking? Authorizing Provider  albuterol (PROVENTIL HFA;VENTOLIN HFA)  108 (90 Base) MCG/ACT inhaler Inhale 2 puffs into the lungs every 4 (four) hours as needed for wheezing or shortness of breath (cough, shortness of breath or wheezing.). 03/30/15  Yes Carmelina Dane, MD  amoxicillin-clavulanate (AUGMENTIN) 875-125 MG tablet Take 1 tablet by mouth 2 (two) times daily. 03/30/15  Yes Carmelina Dane, MD  cetirizine (ZYRTEC) 10 MG chewable tablet Chew 10 mg by mouth daily.   Yes Historical Provider, MD  chlorpheniramine-HYDROcodone (TUSSIONEX PENNKINETIC ER) 10-8 MG/5ML SUER Take 5 mLs by mouth 2 (two) times daily. 03/30/15  Yes Carmelina Dane, MD  hydrochlorothiazide (MICROZIDE) 12.5 MG capsule Take 12.5 mg by mouth daily.   Yes Historical Provider, MD  ibuprofen (ADVIL,MOTRIN) 200 MG tablet Take 200-400 mg by mouth every 6 (six) hours as needed for pain. Reported on 03/30/2015   Yes Historical Provider, MD  levothyroxine (SYNTHROID, LEVOTHROID) 125 MCG tablet Take 1 tablet (125 mcg total) by mouth daily before breakfast. 02/14/14  Yes Josalyn Funches, MD  Multiple Vitamin (MULTIVITAMIN) tablet Take 1 tablet by mouth daily.   Yes Historical Provider, MD  QUEtiapine (SEROQUEL XR) 50 MG TB24 24 hr tablet Take 50 mg by mouth.   Yes Historical Provider, MD  albuterol (PROVENTIL HFA;VENTOLIN HFA) 108 (90 BASE) MCG/ACT inhaler Inhale 2 puffs into the lungs every 6 (six) hours as needed for wheezing or shortness of breath. Patient not taking: Reported on 04/09/2015 02/14/14   Quentin Angst, MD  Cholecalciferol (VITAMIN D3) 400 UNITS tablet Take 2 tablets (800 Units total) by mouth daily. Patient not taking: Reported on 03/30/2015 02/14/14   Dessa Phi, MD         Melatonin 3 MG CAPS Take by mouth. Reported on 04/09/2015    Historical Provider, MD  metFORMIN (GLUMETZA) 500 MG (MOD) 24 hr tablet Take 500 mg by mouth daily with breakfast. Reported on 04/09/2015    Historical Provider, MD  Vitamin D, Ergocalciferol, (DRISDOL) 50000 UNITS CAPS capsule Take 1 capsule  (50,000 Units total) by mouth every 7 (seven) days. Patient not taking: Reported on 03/30/2015 07/31/13   Doris Cheadle, MD    Allergies  Allergen Reactions  . Mold Extract [Trichophyton]     Cold, asthma, occurs in spells    Past Surgical History  Procedure Laterality Date  . Dental surgery      Social History  Substance Use Topics  . Smoking status: Never Smoker   . Smokeless tobacco: Never Used  . Alcohol Use: No    Family History  Problem Relation Age of Onset  . Diabetes Mother   . Hypertension Mother   . Psoriasis Father   . Healthy Brother   . Healthy Sister     Medication list has been reviewed and updated.  Physical Examination:  Physical Exam  Constitutional: She is oriented to person, place, and time. She appears well-developed and well-nourished. No distress.  HENT:  Head: Normocephalic and atraumatic.  Right Ear: Hearing, tympanic membrane, external ear and ear canal normal.  Left Ear: Hearing, tympanic membrane, external ear and ear canal normal.  Nose: Nose normal.  Mouth/Throat: Uvula is midline, oropharynx is clear and moist and mucous membranes are normal.  Eyes: Conjunctivae and lids are normal. Right eye exhibits no discharge. Left eye exhibits no discharge. No scleral icterus.  Neck: Trachea normal. Carotid bruit is not present. No thyromegaly present.  Cardiovascular: Normal rate, regular rhythm, normal heart sounds and normal pulses.   No murmur heard. Pulmonary/Chest: Effort normal and breath sounds normal. No respiratory distress. She has no wheezes. She has no rhonchi. She has no rales.  Abdominal: Normal appearance.  Musculoskeletal: Normal range of motion.  Lymphadenopathy:       Head (right side): No submental, no submandibular and no tonsillar adenopathy present.       Head (left side): No submental, no submandibular and no tonsillar adenopathy present.    She has no cervical adenopathy.  Neurological: She is alert and oriented to  person, place, and time.  Skin: Skin is warm, dry and intact. No lesion and no rash noted.  Psychiatric: She has a normal mood and affect. Her speech is normal and behavior is normal. Thought content normal.  Difficulty with memory Stutters some   BP 112/79 mmHg  Pulse 83  Temp(Src) 97.9 F (36.6 C) (Oral)  Resp 16  Ht 5' 5.25" (1.657 m)  Wt 242 lb 9.6 oz (110.043 kg)  BMI 40.08 kg/m2  SpO2 96%  LMP 03/25/2015  Assessment and Plan:  Awaiting records to be faxed over from [facility]. Once reviewed will refill pertinent meds and call pt. Pt appears to have a mental disability, will attempt to learn more through records.  1. Hypothyroidism, unspecified hypothyroidism type - TSH  2. Vitamin D deficiency - Vitamin D 1,25 dihydroxy  3. Asthma, mild persistent, uncomplicated Will refill albuterol with other meds to be refilled.  4. Allergic rhinitis, unspecified allergic rhinitis type Will refill zyrtec with other meds  to be refilled  5. Memory difficulty Awaiting records to be faxed from [facility].  6. Edema, unspecified type Will refill hctz with other meds to be refilled. - CBC - Comprehensive metabolic panel   Roswell Miners. Dyke Brackett, MHS [facility]  04/09/2015

## 2015-04-09 NOTE — Telephone Encounter (Signed)
Faxed signed authorization to release medical records([facility]), per Lanier Clam PA-C.

## 2015-04-09 NOTE — Patient Instructions (Signed)
I will call you with your lab results. Let's hold off on refilling meds at this time - waiting to get faxes from [facility].

## 2015-04-12 LAB — VITAMIN D 1,25 DIHYDROXY
VITAMIN D 1, 25 (OH) TOTAL: 38 pg/mL (ref 18–72)
VITAMIN D3 1, 25 (OH): 38 pg/mL

## 2015-04-25 ENCOUNTER — Telehealth: Payer: Self-pay | Admitting: Physician Assistant

## 2015-04-25 NOTE — Telephone Encounter (Signed)
Called [facility] [place] trying to get records for patient. I left a message.

## 2015-05-10 ENCOUNTER — Telehealth: Payer: Self-pay

## 2015-05-10 DIAGNOSIS — E039 Hypothyroidism, unspecified: Secondary | ICD-10-CM

## 2015-05-10 NOTE — Telephone Encounter (Signed)
PT req. Refill to: Pharmacy:  [id number] - [place] ([place]), [place] - [address] For: Original Order:  levothyroxine (SYNTHROID, LEVOTHROID) 125 MCG tablet [[id number]] [phone]

## 2015-05-13 MED ORDER — LEVOTHYROXINE SODIUM 125 MCG PO TABS: 125.0000 ug | ORAL_TABLET | Freq: Every day | ORAL | Status: DC

## 2015-05-13 NOTE — Telephone Encounter (Signed)
Refilled

## 2015-05-16 ENCOUNTER — Encounter: Payer: Self-pay | Admitting: Physician Assistant

## 2015-05-16 ENCOUNTER — Telehealth: Payer: Self-pay | Admitting: Physician Assistant

## 2015-05-16 DIAGNOSIS — F339 Major depressive disorder, recurrent, unspecified: Secondary | ICD-10-CM

## 2015-05-16 DIAGNOSIS — R609 Edema, unspecified: Secondary | ICD-10-CM

## 2015-05-16 DIAGNOSIS — F411 Generalized anxiety disorder: Secondary | ICD-10-CM

## 2015-05-16 DIAGNOSIS — E282 Polycystic ovarian syndrome: Secondary | ICD-10-CM

## 2015-05-16 DIAGNOSIS — G4733 Obstructive sleep apnea (adult) (pediatric): Secondary | ICD-10-CM

## 2015-05-16 DIAGNOSIS — F329 Major depressive disorder, single episode, unspecified: Secondary | ICD-10-CM | POA: Insufficient documentation

## 2015-05-16 DIAGNOSIS — R4189 Other symptoms and signs involving cognitive functions and awareness: Secondary | ICD-10-CM

## 2015-05-16 DIAGNOSIS — J4520 Mild intermittent asthma, uncomplicated: Secondary | ICD-10-CM

## 2015-05-16 MED ORDER — ALBUTEROL SULFATE HFA 108 (90 BASE) MCG/ACT IN AERS: 2.0000 | INHALATION_SPRAY | RESPIRATORY_TRACT | Status: DC | PRN

## 2015-05-16 MED ORDER — METFORMIN HCL ER (MOD) 500 MG PO TB24: 500.0000 mg | ORAL_TABLET | Freq: Every day | ORAL | Status: DC

## 2015-05-16 MED ORDER — HYDROCHLOROTHIAZIDE 12.5 MG PO CAPS: 12.5000 mg | ORAL_CAPSULE | Freq: Every day | ORAL | Status: DC

## 2015-05-16 NOTE — Telephone Encounter (Signed)
Spoke with patient. She is needing refills of hctz, metformin, and albuterol. She has been without seroquel for 2 weeks now and mood is ok. She wants to see how she does without seroquel and see if she can stay off. Vit D normal - she will just take multivitamin.  I was able to review records from [facility] [place] - they were managing her for GAD and MDD (treating with seroquel), hypothyroidism, LE edema (hctz) and PCOS. PCOS dx'd 01/2015 due to abnormal periods and elevated free testosterone of 9. Other hormones normal - fsh, lh, prolactin.  Pt has hx OSA but does not wear CPAP. States her current living environment is not a good environment for a CPAP and machine and she states "I'll leave it at that". She wants to get another CPAP machine but says she will have to wait until she moves out of her parent's house.  She will return for follow up in 2 months.

## 2015-05-23 ENCOUNTER — Telehealth: Payer: Self-pay

## 2015-05-23 MED ORDER — METFORMIN HCL ER 500 MG PO TB24: 500.0000 mg | ORAL_TABLET | Freq: Every day | ORAL | Status: DC

## 2015-05-23 NOTE — Telephone Encounter (Signed)
Received fax stating the metformin Elmon Kirschner(Glumetza) ER requires a PA. I called pt and verified that there is not any reason that she needs to take this version of Metformin ER and is willing to try the generic that should be covered and a lot less expensive. Sent in new Rx.

## 2015-06-01 ENCOUNTER — Ambulatory Visit (INDEPENDENT_AMBULATORY_CARE_PROVIDER_SITE_OTHER): Payer: BLUE CROSS/BLUE SHIELD

## 2015-06-01 ENCOUNTER — Ambulatory Visit (INDEPENDENT_AMBULATORY_CARE_PROVIDER_SITE_OTHER): Payer: BLUE CROSS/BLUE SHIELD | Admitting: Family Medicine

## 2015-06-01 VITALS — BP 108/72 | HR 86 | Temp 98.7°F | Resp 14 | Ht 65.0 in | Wt 249.0 lb

## 2015-06-01 DIAGNOSIS — M25562 Pain in left knee: Secondary | ICD-10-CM

## 2015-06-01 DIAGNOSIS — M6520 Calcific tendinitis, unspecified site: Secondary | ICD-10-CM

## 2015-06-01 DIAGNOSIS — S96912A Strain of unspecified muscle and tendon at ankle and foot level, left foot, initial encounter: Secondary | ICD-10-CM

## 2015-06-01 DIAGNOSIS — B351 Tinea unguium: Secondary | ICD-10-CM

## 2015-06-01 DIAGNOSIS — M7731 Calcaneal spur, right foot: Secondary | ICD-10-CM

## 2015-06-01 DIAGNOSIS — M25572 Pain in left ankle and joints of left foot: Secondary | ICD-10-CM

## 2015-06-01 NOTE — Patient Instructions (Addendum)
Take 2 Aleve 220 mg twice daily  In addition to that you can take the Tylenol (acetaminophen) 500 mg 2 pills 3 times daily if needed for additional pain  Use a bag of ice on the painful areas of the knee and ankle several times daily for the next few days  Stay off work through Sunday. If you do not feel like you can work Monday let us know and we will extend your excuse through Monday  Once the knee is feeling better, start doing some knee strengthening exercises as discussed  Try to do some regular walking when you're leg starts feeling a little better  Work hard on eating less to try and lose some weight so your knee and ankle and foot do not have to take so much stress  Discuss the weight issues with Lanier ClamNicole Bush, PA-C. Also asked her about the possibility of treating the fungus of the toenails with some terbinafine. Advise you set up an appointment to see her the next month or 2.    IF you received an x-ray today, you will receive an invoice from [facility]. Please contact [facility] at [phone] with questions or concerns regarding your invoice.   IF you received labwork today, you will receive an invoice from [facility]. Please contact [facility] at [phone] with questions or concerns regarding your invoice.   Our billing staff will not be able to assist you with questions regarding bills from these companies.  You will be contacted with the lab results as soon as they are available. The fastest way to get your results is to activate your My Chart account. Instructions are located on the last page of this paperwork. If you have not heard from us regarding the results in 2 weeks, please contact this office.

## 2015-06-01 NOTE — Progress Notes (Signed)
Patient ID: Kristi BambergJennifer Ortiz, female    DOB: [birth year]  Age: 45 y.o. MRN: [id number]  Chief Complaint  Patient presents with  . Leg Injury    fall down walking to car    Subjective:   Patient was walking on the walkway out from her parents house yesterday and her leg gave way and she fell. She has had pain in the right knee and right ankle since then. She rested it and elevated it. She has been walking around somewhat but it hurts her pain is unstable. She hurts from the knee down to the ankle. 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  She works at [employer] [address]. She was scheduled to work this afternoon and the next couple of days, off Tuesday. She stands as a Conservation officer, naturecashier.  Current allergies, medications, problem list, past/family and social histories reviewed.  Objective:  BP 108/72 mmHg  Pulse 86  Temp(Src) 98.7 F (37.1 C) (Oral)  Resp 14  Ht 5\' 5"  (1.651 m)  Wt 249 lb (112.946 kg)  BMI 41.44 kg/m2  SpO2 97%  LMP 05/08/2015  No major acute distress. No gross swelling. She has some tenderness and mild crepitance in the right knee but no effusion. Ligaments seem intact. Negative Homans. Calf is not directly tender on compression and palpation. Her right ankle is a little tender, especially on dorsiflexion of the ankle. Once again no erythema or swelling. She has extensive onychomycosis of her toenails and a lot of thick crusted skin around the calluses of her foot. Left ankle has been a little painful also, 2-3/10 as opposed to the right which is been 4-7/10.  She is tender in the left ankle without swelling.  Assessment & Plan:   Assessment: 1. Pain in joint, ankle and foot, left   2. Left knee pain   3. Left ankle strain, initial encounter   4. Onychomycosis   5. Calcific tendonitis   6. Calcaneal spur of foot, right       Plan: X-ray knee and ankle  Orders Placed This Encounter  Procedures  . DG Knee Complete 4 Views  Right    Order Specific Question:  Reason for Exam (SYMPTOM  OR DIAGNOSIS REQUIRED)    Answer:  fell, hurt knee and ankle right    Order Specific Question:  Is the patient pregnant?    Answer:  No     Comments:  last month, not sexually involved    Order Specific Question:  Preferred imaging location?    Answer:  External  . DG Ankle Complete Right    Standing Status: Future     Number of Occurrences: 1     Standing Expiration Date: 05/31/2016    Order Specific Question:  Reason for Exam (SYMPTOM  OR DIAGNOSIS REQUIRED)    Answer:  fell, hurt knee and ankle right    Order Specific Question:  Is the patient pregnant?    Answer:  No     Comments:  last month, not sexually involved    Order Specific Question:  Preferred imaging location?    Answer:  External    No orders of the defined types were placed in this encounter.     No acute bony abnormality of knee or ankle or foot, but there is wear and tear degenerative change, mild effusion, calcific tendinitis of the quadriceps tendon, and calcaneal spurs. See radiology report.  See instructions    Patient Instructions  Take 2 Aleve 220 mg twice daily  In addition to that you can take the Tylenol (acetaminophen) 500 mg 2 pills 3 times daily if needed for additional pain  Use a bag of ice on the painful areas of the knee and ankle several times daily for the next few days  Stay off work through Sunday. If you do not feel like you can work Monday let us know and we will extend your excuse through Monday  Once the knee is feeling better, start doing some knee strengthening exercises as discussed  Try to do some regular walking when you're leg starts feeling a little better  Work hard on eating less to try and lose some weight so your knee and ankle and foot do not have to take so much stress  Discuss the weight issues with Kristi Clam, PA-C. Also asked her about the possibility of treating the fungus of the toenails with some  terbinafine. Advise you set up an appointment to see her the next month or 2.    IF you received an x-ray today, you will receive an invoice from [facility]. Please contact [facility] at [phone] with questions or concerns regarding your invoice.   IF you received labwork today, you will receive an invoice from [facility]. Please contact [facility] at [phone] with questions or concerns regarding your invoice.   Our billing staff will not be able to assist you with questions regarding bills from these companies.  You will be contacted with the lab results as soon as they are available. The fastest way to get your results is to activate your My Chart account. Instructions are located on the last page of this paperwork. If you have not heard from [place] regarding the results in 2 weeks, please contact this office.         Return if symptoms worsen or fail to improve.   Milon Dethloff, MD 06/01/2015

## 2015-06-07 ENCOUNTER — Telehealth: Payer: Self-pay

## 2015-06-07 NOTE — Telephone Encounter (Signed)
Patient called for X-Ray results.

## 2015-06-18 ENCOUNTER — Telehealth: Payer: Self-pay

## 2015-06-18 DIAGNOSIS — M25562 Pain in left knee: Secondary | ICD-10-CM

## 2015-06-18 NOTE — Telephone Encounter (Signed)
The patient called to request an orthopedic referral for her leg injury.  She was seen on 06/01/15 by Dr Alwyn RenHopper.  CB#: [phone]

## 2015-06-19 NOTE — Telephone Encounter (Signed)
Ok for referral?

## 2015-06-21 NOTE — Telephone Encounter (Signed)
Please refer to ortho 

## 2015-06-21 NOTE — Telephone Encounter (Signed)
IC pt and advised the Orthopedic referral has been put in and she should hear from office with appointment.

## 2015-07-04 ENCOUNTER — Other Ambulatory Visit: Payer: Self-pay

## 2015-07-04 DIAGNOSIS — M25562 Pain in left knee: Secondary | ICD-10-CM

## 2015-07-09 NOTE — Telephone Encounter (Signed)
Patient LMOVM  Friday, July 05, 2015 requesting a copy of her x-ray from 06/01/15    Please call when ready for pick up   [phone]

## 2015-07-11 ENCOUNTER — Telehealth: Payer: Self-pay

## 2015-07-11 NOTE — Telephone Encounter (Signed)
Tried to call pt. To get more information, like have she been seen here before for reasons why she would need this referral and if not then she will need an office visit, left vm to call us back

## 2015-07-11 NOTE — Telephone Encounter (Signed)
Patient requesting a referral for sleep apnea.   [phone]

## 2015-07-15 NOTE — Telephone Encounter (Signed)
Advised to call back with information.

## 2015-07-17 DIAGNOSIS — M2341 Loose body in knee, right knee: Secondary | ICD-10-CM | POA: Diagnosis not present

## 2015-07-17 DIAGNOSIS — M1711 Unilateral primary osteoarthritis, right knee: Secondary | ICD-10-CM | POA: Diagnosis not present

## 2015-07-17 DIAGNOSIS — M25561 Pain in right knee: Secondary | ICD-10-CM | POA: Diagnosis not present

## 2015-10-23 ENCOUNTER — Encounter: Payer: Self-pay | Admitting: Physician Assistant

## 2015-10-23 ENCOUNTER — Ambulatory Visit (INDEPENDENT_AMBULATORY_CARE_PROVIDER_SITE_OTHER): Payer: BLUE CROSS/BLUE SHIELD | Admitting: Physician Assistant

## 2015-10-23 VITALS — BP 120/84 | HR 86 | Temp 97.9°F | Resp 17 | Ht 65.0 in | Wt 247.0 lb

## 2015-10-23 DIAGNOSIS — M6281 Muscle weakness (generalized): Secondary | ICD-10-CM | POA: Diagnosis not present

## 2015-10-23 DIAGNOSIS — R29898 Other symptoms and signs involving the musculoskeletal system: Secondary | ICD-10-CM

## 2015-10-23 LAB — POCT CBC
Granulocyte percent: 70.3 %G (ref 37–80)
HCT, POC: 37.6 % — AB (ref 37.7–47.9)
Hemoglobin: 13.2 g/dL (ref 12.2–16.2)
LYMPH, POC: 1.8 (ref 0.6–3.4)
MCH, POC: 29 pg (ref 27–31.2)
MCHC: 35.2 g/dL (ref 31.8–35.4)
MCV: 82.4 fL (ref 80–97)
MID (CBC): 0.6 (ref 0–0.9)
MPV: 7.7 fL (ref 0–99.8)
PLATELET COUNT, POC: 246 10*3/uL (ref 142–424)
POC Granulocyte: 5.8 (ref 2–6.9)
POC LYMPH %: 22.1 % (ref 10–50)
POC MID %: 7.6 % (ref 0–12)
RBC: 4.56 M/uL (ref 4.04–5.48)
RDW, POC: 14.2 %
WBC: 8.2 10*3/uL (ref 4.6–10.2)

## 2015-10-23 LAB — TSH: TSH: 3.17 mIU/L

## 2015-10-23 LAB — COMPLETE METABOLIC PANEL WITH GFR
ALT: 10 U/L (ref 6–29)
AST: 12 U/L (ref 10–30)
Albumin: 3.8 g/dL (ref 3.6–5.1)
Alkaline Phosphatase: 53 U/L (ref 33–115)
BILIRUBIN TOTAL: 0.4 mg/dL (ref 0.2–1.2)
BUN: 11 mg/dL (ref 7–25)
CO2: 24 mmol/L (ref 20–31)
Calcium: 8.7 mg/dL (ref 8.6–10.2)
Chloride: 105 mmol/L (ref 98–110)
Creat: 0.53 mg/dL (ref 0.50–1.10)
GFR, Est African American: >89 mL/min (ref >=60)
GLUCOSE: 100 mg/dL — AB (ref 65–99)
Potassium: 3.8 mmol/L (ref 3.5–5.3)
SODIUM: 137 mmol/L (ref 135–146)
TOTAL PROTEIN: 6.1 g/dL (ref 6.1–8.1)

## 2015-10-23 LAB — POCT SEDIMENTATION RATE: POCT SED RATE: 30 mm/h — AB (ref 0–22)

## 2015-10-23 LAB — GLUCOSE, POCT (MANUAL RESULT ENTRY): POC Glucose: 112 mg/dl — AB (ref 70–99)

## 2015-10-23 NOTE — Patient Instructions (Addendum)
   I have placed a referral with neurology.  Please await that contact. I will follow up with you within 7-10 days about your lab work.   IF you received an x-ray today, you will receive an invoice from [facility]. Please contact [facility] at [phone] with questions or concerns regarding your invoice.   IF you received labwork today, you will receive an invoice from [facility]. Please contact [facility] at [phone] with questions or concerns regarding your invoice.   Our billing staff will not be able to assist you with questions regarding bills from these companies.  You will be contacted with the lab results as soon as they are available. The fastest way to get your results is to activate your My Chart account. Instructions are located on the last page of this paperwork. If you have not heard from us regarding the results in 2 weeks, please contact this office.

## 2015-10-23 NOTE — Progress Notes (Signed)
[facility] [address], [place] [address] [phone]  By signing my name below I, Raven Small, attest that this documentation has been prepared under the direction and in the presence of Trena Platt PA. Electonically Signed. Raven Small, Scribe 10/23/2015 at 10:13 AM  Date:  10/23/2015   Name:  Kristi Ortiz   DOB:  1970-09-06   MRN:  [id number]  PCP:  Lora Paula, MD    History of Present Illness: Chief Complaint  Patient presents with   Other    Pt states she "cannot control her hands" While holding objects.    Medication Refill    zyrtec and microzide     Kristi Ortiz is a 45 y.o. female patient who presents to [facility] for unintentional dropping of objects for the past 4-5 months. She reports occasionally dropping objects such as lids and remote control and breaking objects such as eggs. Pt is able to open jars sometimes. She is left hand dominant. She denies neck or shoulder injury. She also reports confusion and memory lapse for almost her entire life. She reports trouble remember peoples names and faces who she hasn't seen in the while. She is concerned because her brother inlaw was diagnosed with MS this years, but no known family hx of neurological disorders. She denies hand pain, tremors, dizziness and double vision.    She later divulges fatigue. She denies depressive symptoms. She reports having auditory hallucinations, hearing her mothers name. No auditory commands or disturbing sounds. Reports she has seen a possum however "it was cute" She currently is not taking any anti-depressant/anti-psychotic  Patient Active Problem List   Diagnosis Date Noted   Cognitive impairment 05/16/2015   GAD (generalized anxiety disorder) 05/16/2015   MDD (major depressive disorder) (HCC) 05/16/2015   Severe obesity (BMI >= 40) (HCC) 02/07/2014   IFG (impaired fasting glucose) 10/26/2013   Allergic rhinitis 12/15/2012   Cerumen impaction  12/15/2012   Hypothyroidism 09/01/2012   OSA (obstructive sleep apnea) 09/01/2012   Memory difficulty 09/01/2012   Asthma 09/01/2012   Edema 09/01/2012    Past Medical History:  Diagnosis Date   Allergy    Arthritis    Asthma Dx1990   Depression    Hypothyroid Dx 1994   Sleep apnea DX 2012   Sleep apnea    Sleep apnea    Sleep apnea    Vitamin D insufficiency 10/26/2013    Past Surgical History:  Procedure Laterality Date   DENTAL SURGERY      Social History  Substance Use Topics   Smoking status: Never Smoker   Smokeless tobacco: Never Used   Alcohol use No    Family History  Problem Relation Age of Onset   Diabetes Mother    Hypertension Mother    Psoriasis Father    Healthy Brother    Healthy Sister    Hyperlipidemia Sister    Hypertension Sister    Mental illness Maternal Grandmother    Cancer Maternal Grandfather    Mental illness Maternal Grandfather    Stroke Maternal Grandfather    Cancer Paternal Grandfather     Allergies  Allergen Reactions   Mold Extract [Trichophyton]     Cold, asthma, occurs in spells    Medication list has been reviewed and updated.  Current Outpatient Prescriptions on File Prior to Visit  Medication Sig Dispense Refill   albuterol (PROVENTIL HFA;VENTOLIN HFA) 108 (90 Base) MCG/ACT inhaler Inhale 2 puffs into the lungs every 4 (  four) hours as needed for wheezing or shortness of breath (cough, shortness of breath or wheezing.). 1 Inhaler 12   cetirizine (ZYRTEC) 10 MG chewable tablet Chew 10 mg by mouth daily.     hydrochlorothiazide (MICROZIDE) 12.5 MG capsule Take 1 capsule (12.5 mg total) by mouth daily. 30 capsule 5   ibuprofen (ADVIL,MOTRIN) 200 MG tablet Take 200-400 mg by mouth every 6 (six) hours as needed for pain. Reported on 03/30/2015     levothyroxine (SYNTHROID, LEVOTHROID) 125 MCG tablet Take 1 tablet (125 mcg total) by mouth daily before breakfast. 90 tablet 1   Melatonin  3 MG CAPS Take by mouth. Reported on 04/09/2015     metFORMIN (GLUCOPHAGE-XR) 500 MG 24 hr tablet Take 1 tablet (500 mg total) by mouth daily with breakfast. 90 tablet 1   Multiple Vitamin (MULTIVITAMIN) tablet Take 1 tablet by mouth daily.     QUEtiapine (SEROQUEL XR) 50 MG TB24 24 hr tablet Take 50 mg by mouth.     No current facility-administered medications on file prior to visit.     Review of Systems  Eyes: Negative for double vision.  Musculoskeletal: Negative for joint pain.  Neurological: Negative for dizziness, tremors and focal weakness.  Psychiatric/Behavioral: Positive for memory loss.   ROS unremarkable unless otherwise specified.  Physical Examination: BP 120/84 (BP Location: Left Arm, Patient Position: Sitting, Cuff Size: Normal)    Pulse 86    Temp 97.9 F (36.6 C) (Oral)    Resp 17    Ht 5\' 5"  (1.651 m)    Wt 247 lb (112 kg)    LMP  (LMP Unknown)    SpO2 95%    BMI 41.10 kg/m  Ideal Body Weight: @FLOWAMB ([id number])@([id number])@  Physical Exam  Constitutional: She is oriented to person, place, and time. She appears well-developed and well-nourished. No distress.  HENT:  Head: Normocephalic and atraumatic.  Right Ear: External ear normal.  Left Ear: External ear normal.  Eyes: Conjunctivae and EOM are normal. Pupils are equal, round, and reactive to light.  Cardiovascular: Normal rate.   Pulmonary/Chest: Effort normal. No respiratory distress.  Neurological: She is alert and oriented to person, place, and time. She has normal strength. No cranial nerve deficit or sensory deficit.  Reflex Scores:      Tricep reflexes are 2+ on the right side and 2+ on the left side.      Patellar reflexes are 2+ on the right side and 2+ on the left side. Skin: She is not diaphoretic.  Psychiatric: She has a normal mood and affect. Her behavior is normal.   Results for orders placed or performed in visit on 10/23/15  POCT CBC  Result Value Ref Range   WBC 8.2 4.6 - 10.2 K/uL   Lymph,  poc 1.8 0.6 - 3.4   POC LYMPH PERCENT 22.1 10 - 50 %L   MID (cbc) 0.6 0 - 0.9   POC MID % 7.6 0 - 12 %M   POC Granulocyte 5.8 2 - 6.9   Granulocyte percent 70.3 37 - 80 %G   RBC 4.56 4.04 - 5.48 M/uL   Hemoglobin 13.2 12.2 - 16.2 g/dL   HCT, POC 72.537.6 (A) 36.637.7 - 47.9 %   MCV 82.4 80 - 97 fL   MCH, POC 29.0 27 - 31.2 pg   MCHC 35.2 31.8 - 35.4 g/dL   RDW, POC 44.014.2 %   Platelet Count, POC 246 142 - 424 K/uL   MPV 7.7 0 - 99.8  fL  POCT glucose (manual entry)  Result Value Ref Range   POC Glucose 112 (A) 70 - 99 mg/dl   Assessment and Plan: Kristi Ortiz is a 45 y.o. female who is here today for cc of loss of grip after long use. --myasthenia gravis, deconditioned weakness, early onset dementia --with longevity of symptoms, I would appreciate a consult by neurology.  Decreased grip strength of left hand - Plan: POCT CBC, POCT glucose (manual entry), POCT SEDIMENTATION RATE, COMPLETE METABOLIC PANEL WITH GFR, TSH, Ambulatory referral to Neurology   Trena Platt, PA-C [facility] 10/23/2015 10:00 AM

## 2015-10-29 ENCOUNTER — Telehealth: Payer: Self-pay

## 2015-10-29 NOTE — Telephone Encounter (Signed)
Please see lab note.

## 2015-11-01 NOTE — Telephone Encounter (Signed)
Unable to reach left vm to call back 

## 2015-11-01 NOTE — Telephone Encounter (Signed)
Spoke to pt. About her results her main question to you Judeth CornfieldStephanie is do you have any concerns about her glucose level, and her sed rate levels, mainly she wanted to know what was your reasoning for the sed rate and is it concerning at all to you the level

## 2015-11-02 NOTE — Telephone Encounter (Signed)
Her glucose was unrevealing and not that elevated.  Sed rates were normal and an inflammatory marker for auto-immune illnesses.

## 2015-11-03 ENCOUNTER — Other Ambulatory Visit: Payer: Self-pay | Admitting: Physician Assistant

## 2015-11-03 DIAGNOSIS — E039 Hypothyroidism, unspecified: Secondary | ICD-10-CM

## 2015-11-04 ENCOUNTER — Encounter: Payer: Self-pay | Admitting: Neurology

## 2015-11-04 ENCOUNTER — Ambulatory Visit (INDEPENDENT_AMBULATORY_CARE_PROVIDER_SITE_OTHER): Payer: BLUE CROSS/BLUE SHIELD | Admitting: Neurology

## 2015-11-04 VITALS — BP 103/70 | HR 84 | Ht 65.0 in | Wt 246.6 lb

## 2015-11-04 DIAGNOSIS — R0683 Snoring: Secondary | ICD-10-CM

## 2015-11-04 DIAGNOSIS — G4710 Hypersomnia, unspecified: Secondary | ICD-10-CM | POA: Diagnosis not present

## 2015-11-04 DIAGNOSIS — R519 Headache, unspecified: Secondary | ICD-10-CM

## 2015-11-04 DIAGNOSIS — R51 Headache: Secondary | ICD-10-CM | POA: Diagnosis not present

## 2015-11-04 DIAGNOSIS — R400 Somnolence: Secondary | ICD-10-CM

## 2015-11-04 DIAGNOSIS — G5602 Carpal tunnel syndrome, left upper limb: Secondary | ICD-10-CM

## 2015-11-04 DIAGNOSIS — G4733 Obstructive sleep apnea (adult) (pediatric): Secondary | ICD-10-CM

## 2015-11-04 NOTE — Patient Instructions (Signed)
Remember to drink plenty of fluid, eat healthy meals and do not skip any meals. Try to eat protein with a every meal and eat a healthy snack such as fruit or nuts in between meals. Try to keep a regular sleep-wake schedule and try to exercise daily, particularly in the form of walking, 20-30 minutes a day, if you can.   As far as diagnostic testing: emg/ncs, sleep evaluation  I would like to see you back for emg/ncs, sooner if we need to. Please call us with any interim questions, concerns, problems, updates or refill requests.   Our phone number is [phone]. We also have an after hours call service for urgent matters and there is a physician on-call for urgent questions. For any emergencies you know to call 911 or go to the nearest emergency room

## 2015-11-04 NOTE — Progress Notes (Signed)
[facility]    Provider:  Dr Lucia GaskinsAhern Referring Provider: Trena PlattEnglish, Kristi  CC:  Loss of control of left hand  HPI:  Kristi BambergJennifer Ortiz is a 45 y.o. female here as a referral from Kristi. Armen [employer]ches for loss of control of left hand. Dropping things, knocking things over. Past medical history of obstructive sleep apnea, ADHD. Has been ongoing 20 years and worsening last several months. Also experiencing confusion, memory loss. Memory loss started years ago, she has problems getting her words out. Stable. She has depression and anxiety. She is a Conservation officer, naturecashier at [employer]. She struggles to keep up with high volume. She can't perform quickly. She has OSA and has not had a cpap for years. She is very tired. She was diagnosed with sleep apnea 10 years ago, she last used a cpap 14 years ago. She snores and wakes herself up with snoring. She works the night shift. She is very tired during the day, she feels tirefd all day, can doze off very easily. MRi of the brain in 2014 was normal. No neck pain. No tingling in the fingers. No episodes of confusion or alteration of consciousness.  Reviewed notes, labs and imaging from outside physicians, which showed:  Personally reviewed MRI images of the brain and agree with the following. IMPRESSION: Unremarkable cranial MRI.  No acute or focal intracranial findings. No generalized or focal cerebral volume loss.  No evidence for white matter disease or chronic hemorrhage.  TSH normal 3.17, CMP and CBC essentially normal, labs drawn August 2017.  Review of Systems: Patient complains of symptoms per HPI as well as the following symptoms: Fatigue, snoring, cramps, blood in urine, memory loss, confusion, headache, sleepiness, snoring, depression, anxiety, too much sleep, decreased energy, disinterest in activities, racing thoughts. Pertinent negatives per HPI. All others negative.   Social History   Social History  . Marital status: Single    Spouse name: N/A    . Number of children: 0  . Years of education: 6916   Occupational History  . [employer] cashier    Social History Main Topics  . Smoking status: Never Smoker  . Smokeless tobacco: Never Used  . Alcohol use No  . Drug use: No  . Sexual activity: Not on file   Other Topics Concern  . Not on file   Social History Narrative   She lives with parents.   She is currently unemployed and has been working on a manuscript (fiction).  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   She is going to [employer] in childhood education.   Caffeine use: Drinks soda ocass- caffeine free   Decaf coffee rarely    Drinks water mostly           Family History  Problem Relation Age of Onset  . Diabetes Mother   . Hypertension Mother   . Psoriasis Father   . Healthy Brother   . Healthy Sister   . Hyperlipidemia Sister   . Hypertension Sister   . Mental illness Maternal Grandmother   . Cancer Maternal Grandfather   . Mental illness Maternal Grandfather   . Stroke Maternal Grandfather   . Cancer Paternal Grandfather     Past Medical History:  Diagnosis Date  . Allergy   . Arthritis   . Asthma Dx1990  . Depression   . Hypothyroid Dx 1994  . Sleep apnea DX 2012  . Sleep apnea   . Sleep apnea   . Sleep apnea   .  Vitamin D insufficiency 10/26/2013    Past Surgical History:  Procedure Laterality Date  . DENTAL SURGERY  2015   Took removal    Current Outpatient Prescriptions  Medication Sig Dispense Refill  . acetaminophen (TYLENOL) 325 MG tablet Take 650 mg by mouth every 6 (six) hours as needed.    Marland Kitchen albuterol (PROVENTIL HFA;VENTOLIN HFA) 108 (90 Base) MCG/ACT inhaler Inhale 2 puffs into the lungs every 4 (four) hours as needed for wheezing or shortness of breath (cough, shortness of breath or wheezing.). 1 Inhaler 12  . cetirizine (ZYRTEC) 10 MG chewable tablet Chew 10 mg by mouth daily.    . hydrochlorothiazide (MICROZIDE) 12.5 MG capsule Take 1 capsule  (12.5 mg total) by mouth daily. 30 capsule 5  . metFORMIN (GLUCOPHAGE-XR) 500 MG 24 hr tablet Take 1 tablet (500 mg total) by mouth daily with breakfast. 90 tablet 1  . Multiple Vitamin (MULTIVITAMIN) tablet Take 1 tablet by mouth daily.    Marland Kitchen levothyroxine (SYNTHROID, LEVOTHROID) 125 MCG tablet TAKE ONE TABLET BY MOUTH ONCE DAILY BEFORE  BREAKFAST 90 tablet 0   No current facility-administered medications for this visit.     Allergies as of 11/04/2015 - Review Complete 11/04/2015  Allergen Reaction Noted  . Mold extract [trichophyton]  04/06/2013    Vitals: BP 103/70 (BP Location: Right Arm, Patient Position: Sitting, Cuff Size: Normal)   Pulse 84   Ht 5\' 5"  (1.651 m)   Wt 246 lb 9.6 oz (111.9 kg)   LMP  (LMP Unknown)   BMI 41.04 kg/m  Last Weight:  Wt Readings from Last 1 Encounters:  11/04/15 246 lb 9.6 oz (111.9 kg)   Last Height:   Ht Readings from Last 1 Encounters:  11/04/15 5\' 5"  (1.651 m)   Physical exam: Exam: Gen: NAD, conversant, well nourised, obese, well groomed                     CV: RRR, no MRG. No Carotid Bruits. No peripheral edema, warm, nontender Eyes: Conjunctivae clear without exudates or hemorrhage  Neuro: Detailed Neurologic Exam  Speech:    Speech is normal; fluent and spontaneous with normal comprehension.  Cognition:    The patient is oriented to person, place, and time;     recent and remote memory intact;     language fluent;     normal attention, concentration,     fund of knowledge Impaired Cranial Nerves:    The pupils are equal, round, and reactive to light. Attempted funduscopic exam could not visualize due to small pupils. Visual fields are full to finger confrontation. Extraocular movements are intact. Trigeminal sensation is intact and the muscles of mastication are normal. The face is symmetric. The palate elevates in the midline. Hearing intact. Voice is normal. Shoulder shrug is normal. The tongue has normal motion without  fasciculations.   Coordination:    Normal finger to nose and heel to shin.  Gait:    Heel-toe and tandem gait intact with some imbalance, mildly wide based due to morbid obesity  Motor Observation:    No asymmetry, no atrophy, and no involuntary movements noted. Tone:    Normal muscle tone.    Posture:    Posture is normal. normal erect    Strength: left decreased grip. Othrewise strength is V/V in the upper and lower limbs.      Sensation: intact to LT     Reflex Exam:  DTR's: Absent AJs    Deep tendon reflexes  in the upper and lower extremities are normal bilaterally.   Toes:    The toes are downgoing bilaterally.   Clonus:    Clonus is absent.       Assessment/Plan:  This is a 45 year old female who complains of memory changes and fatigue however she has obstructive sleep apnea and does not use CPAP, last study was many years ago. We'll refer her for sleep evaluation, discussed that untreated sleep apnea can cause memory changes, excessive daytime fatigue as well as increased risk of stroke, obesity, hypertension and multiple other significant comorbidities. She also reports changes in her left hand, we will perform an EMG nerve conduction study to evaluate for carpal tunnel syndrome or other etiology.  Cc: Holley DexterStephanie Ortiz  Viera Okonski, MD  [facility] [address] [place], [place] [id number]  Phone [phone] Fax [phone]

## 2015-11-05 NOTE — Telephone Encounter (Signed)
Unable to reach pt. But left vm with results

## 2015-11-13 ENCOUNTER — Telehealth: Payer: Self-pay | Admitting: Neurology

## 2015-11-13 NOTE — Telephone Encounter (Signed)
Called patient back. Relayed per Dr Lucia GaskinsAhern that she would like her to have test still. She verbalized understanding. I also explained what EMG.NCS was again to patient.

## 2015-11-13 NOTE — Telephone Encounter (Signed)
Yes, that is what I ordered, an emg/ncs and so that is what I need.

## 2015-11-13 NOTE — Telephone Encounter (Signed)
Dr Lucia GaskinsAhern- please advise  You saw patient on 11/04/15 for Loss of control of left hand. You ordered EMG/NCS and sleep eval.

## 2015-11-13 NOTE — Telephone Encounter (Signed)
Pt called said she "knows she has nerve damage, I have all the symptoms". Do I still need to have that test (NCV/EMG) or is there another test that could be done?

## 2015-11-19 ENCOUNTER — Encounter: Payer: Self-pay | Admitting: Neurology

## 2015-11-19 ENCOUNTER — Ambulatory Visit (INDEPENDENT_AMBULATORY_CARE_PROVIDER_SITE_OTHER): Payer: BLUE CROSS/BLUE SHIELD | Admitting: Neurology

## 2015-11-19 VITALS — BP 104/58 | HR 78 | Resp 18 | Ht 65.0 in | Wt 250.0 lb

## 2015-11-19 DIAGNOSIS — G4710 Hypersomnia, unspecified: Secondary | ICD-10-CM | POA: Diagnosis not present

## 2015-11-19 DIAGNOSIS — G4733 Obstructive sleep apnea (adult) (pediatric): Secondary | ICD-10-CM

## 2015-11-19 NOTE — Progress Notes (Signed)
Subjective:    Patient ID: Kristi Ortiz is a 45 y.o. female.  HPI     Huston FoleySaima Blair Lundeen, MD, PhD [facility] [address], [address] [address] [place], [place] [address]  Dear Desma MaximAntonia,   I saw your patient, Kristi Ortiz, upon your kind request, in my clinic today for initial consultation of her sleep disorder, in particular, concern for underlying obstructive sleep apnea. The patient is unaccompanied today. As you know, Kristi Ortiz is a 45 year old right-handed woman with an underlying medical history of allergies, arthritis, asthma, depression, hypothyroidism, vitamin D deficiency, and morbid obesity, who reports snoring and excessive daytime somnolence. I reviewed your office note from 11/04/2015. She reports having had a sleep study about 5 years ago and she was diagnosed with obstructive sleep apnea. She tried CPAP for about 3 months, but stopped due to "environmental reasons". She states, there were cockroaches in the machine. She then had a dental appliance, but could not use it d/t a strong gag reflex.  Prior sleep test results are not available for my review today. Her Epworth sleepiness score is 15 out of 24 today, her fatigue score is 48.  She is single and has no children. She lives with her parents. She works at [employer]. She is a nonsmoker, drinks alcohol very rarely, and drinks no caffeine on a daily basis. She works as a Conservation officer, naturecashier at [employer], she works part time, usually from either 8:30 PM to 12:30 AM or 4:30 PM to 12:30 AM, Sunday shifts as well, bedtime varies from 10 PM to 1 AM typically and wake up time varies from 7 AM to noon. She denies nocturia, she does not watch TV in bed, she has no pets in the bedroom, denies restless leg symptoms.  Her Past Medical History Is Significant For: Past Medical History:  Diagnosis Date  . Allergy   . Arthritis   . Asthma Dx1990  . Depression   . Hypothyroid Dx 1994  . Sleep apnea DX 2012  . Sleep apnea   . Sleep  apnea   . Sleep apnea   . Vitamin D insufficiency 10/26/2013    Her Past Surgical History Is Significant For: Past Surgical History:  Procedure Laterality Date  . DENTAL SURGERY  2015   Took removal    Her Family History Is Significant For: Family History  Problem Relation Age of Onset  . Diabetes Mother   . Hypertension Mother   . Psoriasis Father   . Healthy Brother   . Healthy Sister   . Hyperlipidemia Sister   . Hypertension Sister   . Mental illness Maternal Grandmother   . Cancer Maternal Grandfather   . Mental illness Maternal Grandfather   . Stroke Maternal Grandfather   . Cancer Paternal Grandfather     Her Social History Is Significant For: Social History   Social History  . Marital status: Single    Spouse name: N/A  . Number of children: 0  . Years of education: 4816   Occupational History  . [employer] cashier    Social History Main Topics  . Smoking status: Never Smoker  . Smokeless tobacco: Never Used  . Alcohol use Yes     Comment: rare  . Drug use: No  . Sexual activity: Not Asked   Other Topics Concern  . None   Social History Narrative   She lives with parents.   She is currently unemployed and has been working on a manuscript (fiction).  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   She is going to [employer] in childhood education.   Caffeine use: Drinks soda caffeine free, chocolate    Decaf coffee rarely    Drinks water mostly           Her Allergies Are:  Allergies  Allergen Reactions  . Mold Extract [Trichophyton]     Cold, asthma, occurs in spells  :   Her Current Medications Are:  Outpatient Encounter Prescriptions as of 11/19/2015  Medication Sig  . acetaminophen (TYLENOL) 325 MG tablet Take 650 mg by mouth every 6 (six) hours as needed.  . cetirizine (ZYRTEC) 10 MG chewable tablet Chew 10 mg by mouth daily.  . COCONUT OIL PO Take by mouth.  Marland Kitchen GINKGO BILOBA PO Take by mouth.  .  hydrochlorothiazide (MICROZIDE) 12.5 MG capsule Take 1 capsule (12.5 mg total) by mouth daily.  Marland Kitchen levothyroxine (SYNTHROID, LEVOTHROID) 125 MCG tablet TAKE ONE TABLET BY MOUTH ONCE DAILY BEFORE  BREAKFAST  . Multiple Vitamin (MULTIVITAMIN) tablet Take 1 tablet by mouth daily.  . [DISCONTINUED] albuterol (PROVENTIL HFA;VENTOLIN HFA) 108 (90 Base) MCG/ACT inhaler Inhale 2 puffs into the lungs every 4 (four) hours as needed for wheezing or shortness of breath (cough, shortness of breath or wheezing.).  . [DISCONTINUED] metFORMIN (GLUCOPHAGE-XR) 500 MG 24 hr tablet Take 1 tablet (500 mg total) by mouth daily with breakfast.   No facility-administered encounter medications on file as of 11/19/2015.   :  Review of Systems:  Out of a complete 14 point review of systems, all are reviewed and negative with the exception of these symptoms as listed below: Review of Systems  Constitutional: Positive for fatigue.  Neurological:       Patient reports that she had a sleep study about 5 years ago. Patient was put on CPAP for a short time. She does not think she can tolerate a CPAP machine now, physically and financially.  Patient reports that she has "spells" of being overwhelmingly tired to the point of being unable to carry out duties.  Some trouble falling and staying asleep, snoring, witnessed apnea, wakes up feeling tired, sometimes gets morning headaches, daytime tiredness  Epworth Sleepiness Scale 0= would never doze 1= slight chance of dozing 2= moderate chance of dozing 3= high chance of dozing  Sitting and reading:2 Watching TV:2 Sitting inactive in a public place (ex. Theater or meeting):3 As a passenger in a car for an hour without a break:2 Lying down to rest in the afternoon:3 Sitting and talking to someone:1 Sitting quietly after lunch (no alcohol):1 In a car, while stopped in traffic:1 Total:15   Objective:  Neurologic Exam  Physical Exam Physical Examination:   Vitals:    11/19/15 1329  BP: (!) 104/58  Pulse: 78  Resp: 18    General Examination: The patient is a very pleasant 45 y.o. female in no acute distress. She appears well-developed and well-nourished and well groomed.   HEENT: Normocephalic, atraumatic, pupils are equal, round and reactive to light and accommodation. Funduscopic exam is normal with sharp disc margins noted. Extraocular tracking is good without limitation to gaze excursion or nystagmus noted. Normal smooth pursuit is noted. Hearing is grossly intact. Face is symmetric with normal facial animation and normal facial sensation. Speech is clear with no dysarthria noted. There is no hypophonia. There is no lip, neck/head, jaw or voice tremor. Neck is supple with full range of passive and active motion. There are no carotid  bruits on auscultation. Oropharynx exam reveals: mild mouth dryness, adequate dental hygiene and moderate airway crowding, due to smaller airway and wider uvula, tonsils in place. Mallampati is class II. Tongue protrudes centrally and palate elevates symmetrically. Tonsils are 1+. Neck size is 16.75 inches. She has a Mild overbite. Nasal inspection reveals no significant nasal mucosal bogginess or redness and no septal deviation.   Chest: Clear to auscultation without wheezing, rhonchi or crackles noted.  Heart: S1+S2+0, regular and normal without murmurs, rubs or gallops noted.   Abdomen: Soft, non-tender and non-distended with normal bowel sounds appreciated on auscultation.  Extremities: There is no pitting edema in the distal lower extremities bilaterally. Pedal pulses are intact.  Skin: Warm and dry without trophic changes noted. There are no varicose veins.  Musculoskeletal: exam reveals no obvious joint deformities, tenderness or joint swelling or erythema.   Neurologically:  Mental status: The patient is awake, alert and oriented in all 4 spheres. Her immediate and remote memory, attention, language skills and fund  of knowledge are fairly appropriate. There is no evidence of aphasia, agnosia, apraxia or anomia. Speech is clear with normal prosody and enunciation. Thought process is linear. Mood is constricted and affect is normal.  Cranial nerves II - XII are as described above under HEENT exam. In addition: shoulder shrug is normal with equal shoulder height noted. Motor exam: Normal bulk, strength and tone is noted. There is no drift, tremor or rebound. Romberg is negative. Reflexes are 2+ throughout. Fine motor skills and coordination: intact with normal finger taps, normal hand movements, normal rapid alternating patting, normal foot taps and normal foot agility.  Cerebellar testing: No dysmetria or intention tremor on finger to nose testing. Heel to shin is unremarkable bilaterally. There is no truncal or gait ataxia.  Sensory exam: intact to light touch, pinprick, vibration, temperature sense in the upper and lower extremities.  Gait, station and balance: She stands easily. No veering to one side is noted. No leaning to one side is noted. Posture is age-appropriate and stance is narrow based. Gait shows normal stride length and normal pace. No problems turning are noted. Tandem walk is unremarkable.  Assessment and Plan:  In summary, Any Mcneice is a very pleasant 45 y.o.-year old female with an underlying medical history of allergies, arthritis, asthma, depression, hypothyroidism, vitamin D deficiency, and morbid obesity, whose history and physical exam are in keeping with obstructive sleep apnea (OSA). She tried CPAP in the past but will not consider it again, as long as she lives with her parents (states environmental reasons). I had a long chat with the patient about my findings and the diagnosis of OSA, its prognosis and treatment options. We talked about medical treatments, surgical interventions and non-pharmacological approaches. I explained in particular the risks and ramifications of untreated  moderate to severe OSA, especially with respect to developing cardiovascular disease down the Road, including congestive heart failure, difficult to treat hypertension, cardiac arrhythmias, or stroke. Even type 2 diabetes has, in part, been linked to untreated OSA. Symptoms of untreated OSA include daytime sleepiness, memory problems, mood irritability and mood disorder such as depression and anxiety, lack of energy, as well as recurrent headaches, especially morning headaches. We talked about trying to maintain a healthy lifestyle in general, as well as the importance of weight control. I encouraged the patient to eat healthy, exercise daily and keep well hydrated, to keep a scheduled bedtime and wake time routine, to not skip any meals and eat healthy snacks  in between meals. I advised the patient not to drive when feeling sleepy. I recommended the following at this time: sleep study (we will score hypopneas at 3% and split the sleep study into diagnostic and treatment portion, if the estimated. 2 hour AHI is >15/h).   I explained the sleep test procedure to the patient and also outlined possible surgical and non-surgical treatment options of OSA, including the use of a custom-made dental device (which would require a referral to a specialist dentist or oral surgeon), upper airway surgical options, such as pillar implants, radiofrequency surgery, tongue base surgery, and UPPP (which would involve a referral to an ENT surgeon). Rarely, jaw surgery such as mandibular advancement may be considered.  I also explained the CPAP treatment option to the patient, who indicated that she would not use CPAP again.   I answered all her questions today and the patient was in agreement. I would like to see her back after the sleep study is completed and encouraged her to call with any interim questions, concerns, problems or updates.   Thank you very much for allowing me to participate in the care of this nice patient.  If I can be of any further assistance to you please do not hesitate to talk to me.   Sincerely,   Huston Foley, MD, PhD

## 2015-11-19 NOTE — Patient Instructions (Addendum)
Based on your symptoms and your exam I believe you are still at risk for obstructive sleep apnea or OSA, and I think we should proceed with a sleep study to determine whether you do or do not have OSA and how severe it is. If you have more than mild OSA, we can consider treatment with CPAP or surgical treatment or a dental appliance. Please remember, the risks and ramifications of moderate to severe obstructive sleep apnea or OSA are: Cardiovascular disease, including congestive heart failure, stroke, difficult to control hypertension, arrhythmias, and even type 2 diabetes has been linked to untreated OSA. Sleep apnea causes disruption of sleep and sleep deprivation in most cases, which, in turn, can cause recurrent headaches, problems with memory, mood, concentration, focus, and vigilance. Most people with untreated sleep apnea report excessive daytime sleepiness, which can affect their ability to drive. Please do not drive if you feel sleepy.   I will likely see you back after your sleep study to go over the test results and where to go from there. We will call you after your sleep study to advise about the results (most likely, you will hear from Lafonda Mossesiana, my nurse) and to set up an appointment at the time, as necessary.    Our sleep lab administrative assistant, Alvis LemmingsDawn will meet with you or call you to schedule your sleep study. If you don't hear back from her by next week please feel free to call her at [phone]. This is her direct line and please leave a message with your phone number to call back if you get the voicemail box. She will call back as soon as possible.

## 2015-12-05 ENCOUNTER — Other Ambulatory Visit: Payer: Self-pay

## 2015-12-05 NOTE — Telephone Encounter (Signed)
Pharm request refills on Zyrtec Pt saw you last month and requested refill on Zyrtec. I did not see that it was done. Do you want to prescribe this for the patient.

## 2015-12-06 MED ORDER — CETIRIZINE HCL 10 MG PO CHEW: 10.0000 mg | CHEWABLE_TABLET | Freq: Every day | ORAL | 3 refills | Status: AC

## 2015-12-15 ENCOUNTER — Ambulatory Visit (INDEPENDENT_AMBULATORY_CARE_PROVIDER_SITE_OTHER): Payer: BLUE CROSS/BLUE SHIELD | Admitting: Neurology

## 2015-12-15 DIAGNOSIS — G4733 Obstructive sleep apnea (adult) (pediatric): Secondary | ICD-10-CM

## 2015-12-15 DIAGNOSIS — G4761 Periodic limb movement disorder: Secondary | ICD-10-CM

## 2015-12-15 DIAGNOSIS — G4720 Circadian rhythm sleep disorder, unspecified type: Secondary | ICD-10-CM

## 2015-12-23 ENCOUNTER — Telehealth: Payer: Self-pay

## 2015-12-23 NOTE — Telephone Encounter (Signed)
Needs to talk with someone about changing her levothyroxine   Best number [phone]

## 2015-12-24 NOTE — Telephone Encounter (Signed)
Called patient. No answer, no machine

## 2015-12-25 ENCOUNTER — Telehealth: Payer: Self-pay | Admitting: Neurology

## 2015-12-25 NOTE — Telephone Encounter (Signed)
Given that she has untreated sleep apnea, and had a normal thyroid test in August, it is much more likely that the difficulty she is having is due to the sleep apnea.  I see that she had a sleep study appointment on 12/15/2015, but I do not see the results.  The levothyroxine can cause some of the symptoms she's feeling, if the dose is too high or too low. She is welcome to come in to review the results from August, and to explore other evaluation.

## 2015-12-25 NOTE — Telephone Encounter (Signed)
Patient wants to change from levothyroxine to a different medication due to depression, anxiety, confussion and problems sleeping.  Her pharmacist told her that it may be due to this medication. Please advise... Best call back number [phone]

## 2015-12-26 ENCOUNTER — Encounter: Payer: BLUE CROSS/BLUE SHIELD | Admitting: Neurology

## 2015-12-26 ENCOUNTER — Telehealth: Payer: Self-pay | Admitting: Neurology

## 2015-12-26 DIAGNOSIS — G4733 Obstructive sleep apnea (adult) (pediatric): Secondary | ICD-10-CM

## 2015-12-26 NOTE — Telephone Encounter (Signed)
Pt would like to know sleep study results. Please call.  

## 2015-12-26 NOTE — Telephone Encounter (Signed)
Gave pt Kristi Ortiz's message and pt reported that she did have her sleep study done. I advised (since we can not see any results on sleep study) that she may try calling [facility] and asking them for an update on results and what the next step is. Pt agreed.

## 2015-12-26 NOTE — Telephone Encounter (Signed)
I advised patient that I will call her back as soon as I get the results.

## 2015-12-26 NOTE — Progress Notes (Signed)
PATIENT'S NAME:  Kristi Ortiz, Bianco DOB:      22-Feb-1971      MR#:    [id number]     DATE OF RECORDING: 12/15/2015 REFERRING M.D.:  --- Study Performed:   Baseline Polysomnogram HISTORY: 45 year old right-handed woman with an underlying medical history of allergies, arthritis, asthma, depression, hypothyroidism, vitamin D deficiency, and morbid obesity, who reports snoring and excessive daytime somnolence. The patient endorsed the Epworth Sleepiness Scale at 15 points and the Fatigue Score at 48 points. The patient's weight 250 pounds with a height of 65 (inches), resulting in a BMI of 0 kg/m2. The patient's neck circumference measured 16.75 inches.  CURRENT MEDICATIONS: Tylenol, Zyrtec, Coconut oil, Ginko Biloba oil, Microzide, Synthroid, MultiVitamin   PROCEDURE:  This is a multichannel digital polysomnogram utilizing the Somnostar 11.2 system.  Electrodes and sensors were applied and monitored per AASM Specifications.   EEG, EOG, Chin and Limb EMG, were sampled at 200 Hz.  ECG, Snore and Nasal Pressure, Thermal Airflow, Respiratory Effort, CPAP Flow and Pressure, Oximetry was sampled at 50 Hz. Digital video and audio were recorded.      BASELINE STUDY  Lights Out was at 21:51 and Lights On at 05:07.  Total recording time (TRT) was 432.5, with a total sleep time (TST) of 166 minutes.   The patient's sleep latency was 244.5 minutes, which is markedly delayed.  REM latency was 286.5 minutes, which is severely prolonged. The sleep efficiency was 38.4 %, which is markedly reduced.     SLEEP ARCHITECTURE:  Wake was 222.5 minutes with moderate sleep fragmentation noted, Stage N1 6.9%, Stage N2 67.8%, which is increased, Stage N3 2.1%, which is decreased and Stage R (REM sleep) 23.2%.   RESPIRATORY ANALYSIS:  There was a total of 171 respiratory events:  30 obstructive apneas, 0 central apneas and 20 mixed apneas with a total of 50 apneas and an apnea index (AI) of 18.1. There were 121 hypopneas with a  hypopnea index of 43.7. The patient also had 0 respiratory event related arousals (RERAs).      The total APNEA/HYPOPNEA INDEX (AHI) was 61.8 and the total RESPIRATORY DISTURBANCE INDEX was 61.8.  38 events occurred in REM sleep and 224 events in NREM. The REM AHI was 59.2, versus a non-REM AHI of 62.6. The patient spent 100% of total sleep time in the supine position. The supine AHI was 61.8 versus a non-supine AHI of 0.0.  OXYGEN SATURATION & C02:  The baseline 02 saturation was 98%, with the lowest being 80%. Time spent below 89% saturation equaled 97 minutes.  The video and audio analysis did not show abnormalities, unusual behaviors, movements, or vocalizations    PERIODIC LIMB MOVEMENTS:   The patient had a total of 223 Periodic Limb Movements.  The Periodic Limb Movement (PLM) index was 80.6 and the PLM Arousal index was 3.3.   IMPRESSION: 1. Obstructive Sleep Apnea  1. Periodic Limb Movement Disorder  2.  Dysfunctions associated with sleep stages or arousal from sleep   RECOMMENDATIONS:  1.  This study demonstrates severe obstructive sleep apnea, with a total AHI of 61.8/hour, and O2 nadir of 80%. Given the patient's medical history and sleep related complaints, a full-night CPAP titration study is recommended to optimize therapy. Other treatment options may include avoidance of supine sleep position along with weight loss, upper airway or jaw surgery in selected patients or the use of an oral appliance in certain patients. ENT evaluation and/or consultation with a maxillofacial surgeon or dentist  may be feasible and some instances.      2.  Severe PLMs were noted without significant arousals; clinical correlation is recommended. Periodic leg movement disorder (PLMD) is often associated with restless leg syndrome. Patients can have periodic leg movements in the absence of RLS symptoms secondary to medication effects, particularly from taking an SSRI type antidepressant.  3. The patient  should be cautioned not to drive, work at heights, or operate dangerous or heavy equipment when tired or sleepy. Review and reiteration of good sleep hygiene measures should be pursued with any patient. 4.  Please note that untreated obstructive sleep apnea carries additional perioperative morbidity. Patients with significant obstructive sleep apnea should receive perioperative PAP therapy and the surgeons and particularly the anesthesiologist should be informed of the diagnosis and the severity of the sleep disordered breathing. 5.  This study shows sleep fragmentation and abnormal sleep stage percentages; these are nonspecific findings and per se do not signify an intrinsic sleep disorder or a cause for the patient's sleep-related symptoms. Causes include (but are not limited to) the first night effect of the sleep study, circadian rhythm disturbances, medication effect or an underlying mood disorder or medical problem.  6. A follow up appointment will be scheduled in the Sleep Clinic at [facility]. The referring provider will be notified of the results.      I certify that I have reviewed the entire raw data recording prior to the issuance of this report in accordance with the Standards of Accreditation of the American Academy of Sleep Medicine (AASM)    Huston Foley, MD, PhD Diplomat, American Board of Psychiatry and Neurology  Diplomat, American Board of Sleep Medicine

## 2015-12-26 NOTE — Telephone Encounter (Signed)
  Patient referred by Dr. Lucia GaskinsAhern, seen by me on 11/19/15, diagnostic PSG on 12/15/15.   Please call and notify the patient that the recent sleep study did confirm the diagnosis of severe obstructive sleep apnea and that I recommend treatment for this in the form of CPAP. This will require a repeat sleep study for proper titration and mask fitting. Please explain to patient and arrange for a CPAP titration study. I have placed an order in the chart. Thanks, and please route to [facility] for scheduling next sleep study.  Huston FoleySaima Sophy Mesler, MD, PhD [facility]

## 2015-12-30 NOTE — Telephone Encounter (Signed)
I gave patient information below, she is willing to see ENT. She has been advised that weight loss will help and she is also looking into some pillows that will help prop her up.

## 2015-12-30 NOTE — Telephone Encounter (Signed)
I will request ENT evaluation and potential treatment surgically for severe obstructive sleep apnea. I would still recommend CPAP trial first but patient declined. She has previously tried an oral appliance and given the severity of her sleep apnea may also not be fully successful. Please promote weight loss as part of her treatment and we will initiate ENT consultation.

## 2015-12-30 NOTE — Telephone Encounter (Signed)
I spoke to patient and she is aware of results and recommendations. She states that she cannot use CPAP due to her current living conditions which damaged her last CPAP. Patient asks if there are any alternatives?

## 2015-12-31 NOTE — Telephone Encounter (Signed)
Patient's records have been sent to ENT. Documented in referral notes. Thanks Annabelle Harmanana .

## 2016-01-13 ENCOUNTER — Telehealth: Payer: Self-pay | Admitting: Neurology

## 2016-01-13 NOTE — Telephone Encounter (Signed)
Actually this order should have been cancelled. Patient is unable to pursue CPAP treatment and we have sent her to ENT for further treatment.

## 2016-01-13 NOTE — Telephone Encounter (Signed)
Called patient to schedule CPAP titration and she said she didn't know that she needed one and wants to know what we are looking for with this test.  I didn't give any information because I am not clinical but she would like a call back from nurse.

## 2016-01-16 DIAGNOSIS — G4733 Obstructive sleep apnea (adult) (pediatric): Secondary | ICD-10-CM | POA: Insufficient documentation

## 2016-01-20 NOTE — Telephone Encounter (Signed)
LM to call back if needed

## 2016-01-20 NOTE — Telephone Encounter (Signed)
Patient is calling back to discuss options for another sleep study.

## 2016-01-21 ENCOUNTER — Ambulatory Visit (INDEPENDENT_AMBULATORY_CARE_PROVIDER_SITE_OTHER): Payer: BLUE CROSS/BLUE SHIELD | Admitting: Family Medicine

## 2016-01-21 ENCOUNTER — Telehealth: Payer: Self-pay | Admitting: Family Medicine

## 2016-01-21 VITALS — BP 122/72 | HR 77 | Temp 97.9°F | Resp 17 | Ht 65.0 in | Wt 251.0 lb

## 2016-01-21 DIAGNOSIS — G4733 Obstructive sleep apnea (adult) (pediatric): Secondary | ICD-10-CM

## 2016-01-21 DIAGNOSIS — Z23 Encounter for immunization: Secondary | ICD-10-CM

## 2016-01-21 DIAGNOSIS — R739 Hyperglycemia, unspecified: Secondary | ICD-10-CM | POA: Diagnosis not present

## 2016-01-21 LAB — POCT GLYCOSYLATED HEMOGLOBIN (HGB A1C): Hemoglobin A1C: 5.8

## 2016-01-21 LAB — GLUCOSE, POCT (MANUAL RESULT ENTRY): POC GLUCOSE: 91 mg/dL (ref 70–99)

## 2016-01-21 NOTE — Telephone Encounter (Signed)
Please call patient. Hemoglobin A1c or three-month blood sugar test in the office was borderline elevated at 5.8. That is not at level of diabetes, but is in the zone of prediabetes. Work on diet, activity/exercise for weight loss, and recheck that level in the next 3-6 months.

## 2016-01-21 NOTE — Progress Notes (Signed)
By signing my name below, I, Mesha Guinyard, attest that this documentation has been prepared under the direction and in the presence of Meredith Staggers, MD.  Electronically Signed: Arvilla Market, Medical Scribe. 01/21/16. 11:27 AM.  Subjective:    Patient ID: Kristi Ortiz, female    DOB: 1971-02-12, 45 y.o.   MRN: [id number]  HPI Chief Complaint  Patient presents with  . Immunizations    tetanus, flu     HPI Comments: Kristi Ortiz is a 45 y.o. female who presents to the [facility] for flu and tetanus vaccine. Pt isn't sure if she had her tetanus shot in the past 10 years. Denies having reactions to the vaccines in the past. Pt is fasting. Denies fevers, and congestion.  Immunization History  Administered Date(s) Administered  . Influenza Split 12/15/2012  . Influenza,inj,Quad PF,36+ Mos 02/07/2014  . PPD Test 02/28/2013   OSA on CPAP: Found relief with CPAP but she hasn't been using it since her CPAP was damaged by insects getting inside machine. Her tongue gets in the way and she finds relief to sleep disturbance when she sits up.  Patient Active Problem List   Diagnosis Date Noted  . Cognitive impairment 05/16/2015  . GAD (generalized anxiety disorder) 05/16/2015  . MDD (major depressive disorder) 05/16/2015  . Severe obesity (BMI >= 40) (HCC) 02/07/2014  . IFG (impaired fasting glucose) 10/26/2013  . Allergic rhinitis 12/15/2012  . Cerumen impaction 12/15/2012  . Hypothyroidism 09/01/2012  . OSA (obstructive sleep apnea) 09/01/2012  . Memory difficulty 09/01/2012  . Asthma 09/01/2012  . Edema 09/01/2012   Past Medical History:  Diagnosis Date  . Allergy   . Arthritis   . Asthma Dx1990  . Depression   . Hypothyroid Dx 1994  . Sleep apnea DX 2012  . Sleep apnea   . Sleep apnea   . Sleep apnea   . Vitamin D insufficiency 10/26/2013   Past Surgical History:  Procedure Laterality Date  . DENTAL SURGERY  2015   Took removal   Allergies   Allergen Reactions  . Mold Extract [Trichophyton]     Cold, asthma, occurs in spells   Prior to Admission medications   Medication Sig Start Date End Date Taking? Authorizing Provider  acetaminophen (TYLENOL) 325 MG tablet Take 650 mg by mouth every 6 (six) hours as needed.   Yes Historical Provider, MD  cetirizine (ZYRTEC) 10 MG chewable tablet Chew 1 tablet (10 mg total) by mouth daily. 12/06/15  Yes Collie Siad English, PA  levothyroxine (SYNTHROID, LEVOTHROID) 125 MCG tablet TAKE ONE TABLET BY MOUTH ONCE DAILY BEFORE  BREAKFAST 11/05/15  Yes Stephanie D English, PA  COCONUT OIL PO Take by mouth.    Historical Provider, MD  GINKGO BILOBA PO Take by mouth.    Historical Provider, MD  hydrochlorothiazide (MICROZIDE) 12.5 MG capsule Take 1 capsule (12.5 mg total) by mouth daily. Patient not taking: Reported on 01/21/2016 05/16/15   Dorna Leitz, PA-C  Multiple Vitamin (MULTIVITAMIN) tablet Take 1 tablet by mouth daily.    Historical Provider, MD   Social History   Social History  . Marital status: Single    Spouse name: N/A  . Number of children: 0  . Years of education: 39   Occupational History  . [employer] cashier    Social History Main Topics  . Smoking status: Never Smoker  . Smokeless tobacco: Never Used  . Alcohol use Yes     Comment: rare  .  Drug use: No  . Sexual activity: Not on file   Other Topics Concern  . Not on file   Social History Narrative   She lives with parents.   She is currently unemployed and has been working on a manuscript (fiction).  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   She is going to [employer] in childhood education.   Caffeine use: Drinks soda caffeine free, chocolate    Decaf coffee rarely    Drinks water mostly          Review of Systems  Constitutional: Negative for fever.  HENT: Negative for congestion.   Allergic/Immunologic: Positive for environmental allergies (no recent sx's).    Objective:    Physical Exam  Constitutional: She appears well-developed and well-nourished. No distress.  HENT:  Head: Normocephalic and atraumatic.  Eyes: Conjunctivae are normal.  Neck: Neck supple.  Cardiovascular: Normal rate, regular rhythm and normal heart sounds.  Exam reveals no gallop and no friction rub.   No murmur heard. Pulmonary/Chest: Effort normal and breath sounds normal. No respiratory distress. She has no wheezes. She has no rales.  Musculoskeletal: She exhibits no edema.  Neurological: She is alert.  Skin: Skin is warm and dry.  Psychiatric: She has a normal mood and affect. Her behavior is normal.  Nursing note and vitals reviewed.  BP 122/72 (BP Location: Right Arm, Patient Position: Sitting, Cuff Size: Normal)   Pulse 77   Temp 97.9 F (36.6 C) (Oral)   Resp 17   Ht 5\' 5"  (1.651 m)   Wt 251 lb (113.9 kg)   LMP 12/25/2015 (Approximate)   SpO2 94%   BMI 41.77 kg/m   Results for orders placed or performed in visit on 01/21/16  POCT glucose (manual entry)  Result Value Ref Range   POC Glucose 91 70 - 99 mg/dl  POCT glycosylated hemoglobin (Hb A1C)  Result Value Ref Range   Hemoglobin A1C 5.8    Assessment & Plan:  Kristi BambergJennifer Ortiz is a 45 y.o. female Need for diphtheria-tetanus-pertussis (Tdap) vaccine - Plan: Tdap vaccine greater than or equal to 7yo IM Needs flu shot - Plan: Flu Vaccine QUAD 36+ mos IM  - Flu vaccine and tdap given.  OSA (obstructive sleep apnea)  - Not currently on CPAP, advised to discuss with her sleep specialist, and also discussed concerns of oxygen use with sleep specialist.   Hyperglycemia - Plan: POCT glucose (manual entry), POCT glycosylated hemoglobin (Hb A1C)  - Prior mild hyperglycemia, repeat A1c in the office overall okay, barely prediabetic. Can work on diet, exercise for weight loss and recheck next 6 months.  No orders of the defined types were placed in this encounter.  Patient Instructions   Flu shot and tdap was given  today. You are updated for the next 10 years for tetanus.  Talk to your sleep specialist about the CPAP machine and questions regarding oxygen.  I will check your blood sugar and a 3 month blood sugar test today to make sure it does not look like diabetes as the last visit your blood sugar was borderline elevated. Let us know if you have any questions.   IF you received an x-ray today, you will receive an invoice from [facility]. Please contact [facility] at [phone] with questions or concerns regarding your invoice.   IF you received labwork today, you will receive an invoice from [facility]. Please contact [facility] at [phone] with questions  or concerns regarding your invoice.   Our billing staff will not be able to assist you with questions regarding bills from these companies.  You will be contacted with the lab results as soon as they are available. The fastest way to get your results is to activate your My Chart account. Instructions are located on the last page of this paperwork. If you have not heard from us regarding the results in 2 weeks, please contact this office.        I personally performed the services described in this documentation, which was scribed in my presence. The recorded information has been reviewed and considered, and addended by me as needed.   Signed,   Meredith StaggersJeffrey Aili Casillas, MD [facility].  01/21/16 4:32 PM

## 2016-01-21 NOTE — Patient Instructions (Addendum)
Flu shot and tdap was given today. You are updated for the next 10 years for tetanus.  Talk to your sleep specialist about the CPAP machine and questions regarding oxygen.  I will check your blood sugar and a 3 month blood sugar test today to make sure it does not look like diabetes as the last visit your blood sugar was borderline elevated. Let us know if you have any questions.   IF you received an x-ray today, you will receive an invoice from [facility]. Please contact [facility] at [phone] with questions or concerns regarding your invoice.   IF you received labwork today, you will receive an invoice from [facility]. Please contact [facility] at [phone] with questions or concerns regarding your invoice.   Our billing staff will not be able to assist you with questions regarding bills from these companies.  You will be contacted with the lab results as soon as they are available. The fastest way to get your results is to activate your My Chart account. Instructions are located on the last page of this paperwork. If you have not heard from us regarding the results in 2 weeks, please contact this office.

## 2016-01-23 NOTE — Telephone Encounter (Signed)
Sent pt a letter with results and advice.

## 2016-01-29 ENCOUNTER — Other Ambulatory Visit: Payer: Self-pay | Admitting: Emergency Medicine

## 2016-01-29 ENCOUNTER — Telehealth: Payer: Self-pay

## 2016-01-29 MED ORDER — LEVOTHYROXINE SODIUM 125 MCG PO TABS: ORAL_TABLET | ORAL | 0 refills | Status: DC

## 2016-01-29 NOTE — Telephone Encounter (Signed)
PATIENT STATES SHE NEEDS A REFILL ON LEVOTHYROXINE 125 MCG. SHE SAID STEPHANIE ENGLISH REFILLED IT FOR HER BEFORE. BEST PHONE [phone]  PHARMACY CHOICE IS [facility] ON [address].  MBC

## 2016-01-29 NOTE — Telephone Encounter (Signed)
Levothyroxine reordered and e-scribed to pharmacy

## 2016-02-13 ENCOUNTER — Other Ambulatory Visit: Payer: Self-pay | Admitting: Physician Assistant

## 2016-02-13 ENCOUNTER — Ambulatory Visit (INDEPENDENT_AMBULATORY_CARE_PROVIDER_SITE_OTHER): Payer: Self-pay | Admitting: Neurology

## 2016-02-13 ENCOUNTER — Ambulatory Visit (INDEPENDENT_AMBULATORY_CARE_PROVIDER_SITE_OTHER): Payer: BLUE CROSS/BLUE SHIELD | Admitting: Neurology

## 2016-02-13 DIAGNOSIS — G5602 Carpal tunnel syndrome, left upper limb: Secondary | ICD-10-CM

## 2016-02-13 DIAGNOSIS — R202 Paresthesia of skin: Secondary | ICD-10-CM

## 2016-02-13 DIAGNOSIS — Z0289 Encounter for other administrative examinations: Secondary | ICD-10-CM

## 2016-02-13 NOTE — Progress Notes (Signed)
[facility]    Provider:  Dr Lucia GaskinsAhern  HPI:  Kristi BambergJennifer Hammack is a 45 y.o. female here as a referral from Dr. Armen PickupFunches for loss of control of left hand. Dropping things, knocking things over  Summary:   The left Median 2nd Digit orthodromic sensory nerve showed prolonged distal peak latency (3.9 ms, N<3.4).  The right median/ulnar (palm) comparison nerve showed prolonged distal peak latency (Median Palm, 2.5 ms, N<2.2) and abnormal peak latency difference (Median Palm-Ulnar Palm, 0.7 ms, N<0.4) with a relative median delay.  The left median/ulnar (palm) comparison nerve showed prolonged distal peak latency (Median Palm, 3.1 ms, N<2.2) and abnormal peak latency difference (Median Palm-Ulnar Palm, 1.1 ms, N<0.4) with a relative median delay.  All other conductions normal as per below  EMG needle study of selected left upper extremity muscles was performed. The following muscles were normal: Deltoid, Triceps, Pronator Teres, Opponens Pollicis, First Dorsal Interosseous.  Conclusion: This is an abnormal study. There is electrophysiologic evidence of mild left>right Carpal Tunnel Syndrome. No suggestion of polyneuropathy or radiculopathy. Suggested conservative measures such as wrist splints.   SNC    Nerve / Sites Rec. Site Peak Lat Ref.  Amp Ref. Segments Distance Peak Diff Ref.    ms ms V V  cm ms ms  L Median - Orthodromic (Dig II, Mid palm)     Dig II Wrist 3.9 ?3.4 12 ?10 Dig II - Wrist 13    L Median, Ulnar - Transcarpal comparison     Median Palm Wrist 3.1 ?2.2 26 ?50 Median Palm - Wrist 8       Ulnar Palm Wrist 1.9 ?2.2 20 ?12 Ulnar Palm - Wrist 8          Median Palm - Ulnar Palm  1.1 ?0.4  L Ulnar - Orthodromic, (Dig V, Mid palm)     Dig V Wrist 2.8 ?3.1 6 ?5 Dig V - Wrist 11    R Median - Orthodromic (Dig II, Mid palm)     Dig II Wrist 3.3 ?3.4 17 ?10 Dig II - Wrist 13    R Median, Ulnar - Transcarpal comparison     Median Palm Wrist 2.5 ?2.2 59 ?50  Median Palm - Wrist 8       Ulnar Palm Wrist 1.8 ?2.2 11 ?12 Ulnar Palm - Wrist 8          Median Palm - Ulnar Palm  0.7 ?0.4  R Ulnar - Orthodromic, (Dig V, Mid palm)     Dig V Wrist 2.6 ?3.1 6 ?5 Dig V - Wrist 11                   MNC    Nerve / Sites Muscle Latency Ref. Amplitude Ref. Rel Amp Segments Distance Lat Diff Velocity Ref. Area    ms ms mV mV %  cm ms m/s m/s mVms  L Median - APB     Wrist APB 4.1 ?4.4 7.6 ?4.0 100 Wrist - APB 7    24.7     Upper arm APB 8.5  6.3  83.7 Upper arm - Wrist 22 4.4 50  21.2  L Ulnar - ADM     Wrist ADM 2.9 ?3.3 10.8 ?6.0 100 Wrist - ADM 7    30.0     B.Elbow ADM 6.4  5.4  49.6 B.Elbow - Wrist 18 3.5 52 ?49 14.9     A.Elbow ADM 7.7  5.7  106 A.Elbow - B.Elbow  7 1.3 56 ?49 16.0         A.Elbow - Wrist  4.7     R Median - APB     Wrist APB 3.3 ?4.4 4.6 ?4.0 100 Wrist - APB 7    14.6     Upper arm APB 8.0  4.0  87.8 Upper arm - Wrist 24 4.6 52  13.0           F  Wave    Nerve F Lat Ref.   ms ms  L Ulnar - ADM 25.6 ?32.0           Naomie DeanAntonia Ahern, MD  [facility] [address] [place], [place] [address]  Phone [phone] Fax [phone]

## 2016-02-16 NOTE — Telephone Encounter (Signed)
Last ov 01/2016 Last nl tsh 10/2015

## 2016-02-18 ENCOUNTER — Other Ambulatory Visit: Payer: Self-pay

## 2016-02-18 MED ORDER — LEVOTHYROXINE SODIUM 125 MCG PO TABS: ORAL_TABLET | ORAL | 1 refills | Status: DC

## 2016-02-18 NOTE — Telephone Encounter (Signed)
tsh 10/2015 nl

## 2016-02-19 NOTE — Progress Notes (Signed)
See procedure note.

## 2016-02-19 NOTE — Procedures (Signed)
[facility]    Provider:  Dr Kineta Fudala  HPI:  Kristi Ortiz is a 44 y.o. female here as a referral from Dr. Funches for loss of control of left hand. Dropping things, knocking things over  Summary:   The left Median 2nd Digit orthodromic sensory nerve showed prolonged distal peak latency (3.9 ms, N<3.4).  The right median/ulnar (palm) comparison nerve showed prolonged distal peak latency (Median Palm, 2.5 ms, N<2.2) and abnormal peak latency difference (Median Palm-Ulnar Palm, 0.7 ms, N<0.4) with a relative median delay.  The left median/ulnar (palm) comparison nerve showed prolonged distal peak latency (Median Palm, 3.1 ms, N<2.2) and abnormal peak latency difference (Median Palm-Ulnar Palm, 1.1 ms, N<0.4) with a relative median delay.  All other conductions normal as per below  EMG needle study of selected left upper extremity muscles was performed. The following muscles were normal: Deltoid, Triceps, Pronator Teres, Opponens Pollicis, First Dorsal Interosseous.  Conclusion: This is an abnormal study. There is electrophysiologic evidence of mild left>right Carpal Tunnel Syndrome. No suggestion of polyneuropathy or radiculopathy. Suggested conservative measures such as wrist splints.   SNC    Nerve / Sites Rec. Site Peak Lat Ref.  Amp Ref. Segments Distance Peak Diff Ref.    ms ms V V  cm ms ms  L Median - Orthodromic (Dig II, Mid palm)     Dig II Wrist 3.9 ?3.4 12 ?10 Dig II - Wrist 13    L Median, Ulnar - Transcarpal comparison     Median Palm Wrist 3.1 ?2.2 26 ?50 Median Palm - Wrist 8       Ulnar Palm Wrist 1.9 ?2.2 20 ?12 Ulnar Palm - Wrist 8          Median Palm - Ulnar Palm  1.1 ?0.4  L Ulnar - Orthodromic, (Dig V, Mid palm)     Dig V Wrist 2.8 ?3.1 6 ?5 Dig V - Wrist 11    R Median - Orthodromic (Dig II, Mid palm)     Dig II Wrist 3.3 ?3.4 17 ?10 Dig II - Wrist 13    R Median, Ulnar - Transcarpal comparison     Median Palm Wrist 2.5 ?2.2 59 ?50  Median Palm - Wrist 8       Ulnar Palm Wrist 1.8 ?2.2 11 ?12 Ulnar Palm - Wrist 8          Median Palm - Ulnar Palm  0.7 ?0.4  R Ulnar - Orthodromic, (Dig V, Mid palm)     Dig V Wrist 2.6 ?3.1 6 ?5 Dig V - Wrist 11                   MNC    Nerve / Sites Muscle Latency Ref. Amplitude Ref. Rel Amp Segments Distance Lat Diff Velocity Ref. Area    ms ms mV mV %  cm ms m/s m/s mVms  L Median - APB     Wrist APB 4.1 ?4.4 7.6 ?4.0 100 Wrist - APB 7    24.7     Upper arm APB 8.5  6.3  83.7 Upper arm - Wrist 22 4.4 50  21.2  L Ulnar - ADM     Wrist ADM 2.9 ?3.3 10.8 ?6.0 100 Wrist - ADM 7    30.0     B.Elbow ADM 6.4  5.4  49.6 B.Elbow - Wrist 18 3.5 52 ?49 14.9     A.Elbow ADM 7.7  5.7  106 A.Elbow - B.Elbow   7 1.3 56 ?49 16.0         A.Elbow - Wrist  4.7     R Median - APB     Wrist APB 3.3 ?4.4 4.6 ?4.0 100 Wrist - APB 7    14.6     Upper arm APB 8.0  4.0  87.8 Upper arm - Wrist 24 4.6 52  13.0           F  Wave    Nerve F Lat Ref.   ms ms  L Ulnar - ADM 25.6 ?32.0           Kristi DeanAntonia Stirling Orton, MD  [facility] [address] [place], [place] [id number]  Phone [phone] Fax [phone]

## 2016-03-05 NOTE — Telephone Encounter (Signed)
Error

## 2016-04-06 ENCOUNTER — Telehealth: Payer: Self-pay | Admitting: Neurology

## 2016-04-06 NOTE — Telephone Encounter (Signed)
Pt called said she has self dx herself as having psoriasis. She wants to know if it can cause sleep apnea.

## 2016-04-06 NOTE — Telephone Encounter (Signed)
Psoriasis does not cause OSA.  Please advise patient.

## 2016-04-07 NOTE — Telephone Encounter (Signed)
I spoke to patient and she is aware of information below. She also asked if there is a link between OSA and hypothyroidism. She asked what are the causes for OSA. I helped educate her further, she voiced understanding. Patient says that she sleeps on a wedge pillow and feels that it helps. Reports that she is having trouble staying asleep. I encouraged her to try Melatonin again. I advised that the best treatment for her OSA is to use a CPAP machine. Patient states that she is in an environment that she cannot use one at this time. I advised her to call back if any further questions, and if she would like to start CPAP we will help her any way possible.

## 2016-05-11 ENCOUNTER — Telehealth: Payer: Self-pay | Admitting: Neurology

## 2016-05-11 NOTE — Telephone Encounter (Signed)
You have already addressed this, thanks.  No new ideas on chinstrap (other than it works in conjunction with CPAP).

## 2016-05-11 NOTE — Telephone Encounter (Signed)
Patient calling to discuss a chin strap to help with sleep. She also wants to discuss carpal tunnel.She has  intermittent numbness in her hands

## 2016-05-11 NOTE — Telephone Encounter (Signed)
I called pt. She says that she saw on TV an ad for a chin strap to keep her mouth closed at night to help her sleep. I advised her that Dr. Frances FurbishAthar does not prescribe any type of chin straps for sleep, other than those that are associated with cpap machines. Pt says that the ENT doctor told her that her tongue is causing her obstructions but did not do any treatment with her. She wants to know if Dr. Frances FurbishAthar has an opinion on this chin strap.  Pt also reports that she has had carpal tunnel syndrome and wants to discuss this with Dr. Frances FurbishAthar. I advised pt that Dr. Frances FurbishAthar does not treat carpal tunnel, but will likely suggest that she speak with her PCP regarding a referral to a hand surgeon for carpal tunnel. Pt verbalized understanding.

## 2016-05-12 NOTE — Telephone Encounter (Signed)
I called pt, left a detailed message on her cell phone number (number listed on DPR) advising her that Dr. Frances FurbishAthar does not have any new ideas on chinstraps other than they work in conjunction with a cpap. I asked her to call back with further questions or concerns.

## 2016-05-12 NOTE — Telephone Encounter (Signed)
Pt called back, she did not listen to VM. RN's msg relayed. Pt did not have any questions. She said "thank you for your help, I will look online and see what I can find".

## 2016-06-02 DIAGNOSIS — F418 Other specified anxiety disorders: Secondary | ICD-10-CM | POA: Diagnosis not present

## 2016-06-02 DIAGNOSIS — G4733 Obstructive sleep apnea (adult) (pediatric): Secondary | ICD-10-CM | POA: Diagnosis not present

## 2016-06-02 DIAGNOSIS — E039 Hypothyroidism, unspecified: Secondary | ICD-10-CM | POA: Diagnosis not present

## 2016-06-02 DIAGNOSIS — G5601 Carpal tunnel syndrome, right upper limb: Secondary | ICD-10-CM | POA: Diagnosis not present

## 2016-06-18 ENCOUNTER — Other Ambulatory Visit: Payer: Self-pay | Admitting: Physician Assistant

## 2016-06-18 DIAGNOSIS — J4520 Mild intermittent asthma, uncomplicated: Secondary | ICD-10-CM

## 2016-06-25 ENCOUNTER — Telehealth: Payer: Self-pay | Admitting: Neurology

## 2016-06-25 DIAGNOSIS — G5603 Carpal tunnel syndrome, bilateral upper limbs: Secondary | ICD-10-CM

## 2016-06-25 NOTE — Telephone Encounter (Signed)
Patient is calling to find out if there is anything she can do besides use wrist splints for her carpal tunnel. She had NCV/EMG on 02-12-17. She says there are still times when she is holding something and  it drops out of her hand, mostly the left hand.

## 2016-06-25 NOTE — Telephone Encounter (Signed)
Pt had EMG/NCV 01/2016 w/ the following conclusion: This is an abnormal study. There is electrophysiologic evidence of mild left>right Carpal Tunnel Syndrome. No suggestion of polyneuropathy or radiculopathy. Suggested conservative measures such as wrist splints.

## 2016-06-25 NOTE — Telephone Encounter (Signed)
We can refer her to the [facility] [place] for evaluation. If she agrees please order thanks

## 2016-06-30 NOTE — Addendum Note (Signed)
Addended by: Donnelly Angelica on: 06/30/2016 02:38 PM   Modules accepted: Orders

## 2016-06-30 NOTE — Telephone Encounter (Signed)
Left mssg re: referral to hand center. Orders placed in EPIC. Pt may call back w/ any questions.

## 2016-07-16 DIAGNOSIS — G4733 Obstructive sleep apnea (adult) (pediatric): Secondary | ICD-10-CM | POA: Diagnosis not present

## 2016-07-24 DIAGNOSIS — M47812 Spondylosis without myelopathy or radiculopathy, cervical region: Secondary | ICD-10-CM | POA: Diagnosis not present

## 2016-07-24 DIAGNOSIS — G5603 Carpal tunnel syndrome, bilateral upper limbs: Secondary | ICD-10-CM | POA: Diagnosis not present

## 2016-08-04 ENCOUNTER — Telehealth: Payer: Self-pay

## 2016-08-04 NOTE — Telephone Encounter (Signed)
Received consult notes from [facility] w/ the following plan per Dr. Merlyn LotKuzma: "Discussed...nature of CTS...treatment options. At this time she would like to proceed w/ injection... Was well tolerated... Will see her back on an as needed basis." Sent to med records for scanning, copy to Dr. Lucia GaskinsAhern for review.

## 2016-09-10 DIAGNOSIS — F331 Major depressive disorder, recurrent, moderate: Secondary | ICD-10-CM | POA: Diagnosis not present

## 2016-09-18 DIAGNOSIS — F331 Major depressive disorder, recurrent, moderate: Secondary | ICD-10-CM | POA: Diagnosis not present

## 2016-10-05 ENCOUNTER — Ambulatory Visit: Payer: Self-pay | Admitting: Family Medicine

## 2016-10-06 DIAGNOSIS — F331 Major depressive disorder, recurrent, moderate: Secondary | ICD-10-CM | POA: Diagnosis not present

## 2016-10-27 DIAGNOSIS — E039 Hypothyroidism, unspecified: Secondary | ICD-10-CM | POA: Diagnosis not present

## 2017-04-09 DIAGNOSIS — J4520 Mild intermittent asthma, uncomplicated: Secondary | ICD-10-CM | POA: Diagnosis not present

## 2017-04-09 DIAGNOSIS — E039 Hypothyroidism, unspecified: Secondary | ICD-10-CM | POA: Diagnosis not present

## 2017-04-28 DIAGNOSIS — E039 Hypothyroidism, unspecified: Secondary | ICD-10-CM | POA: Diagnosis not present

## 2017-09-10 DIAGNOSIS — E039 Hypothyroidism, unspecified: Secondary | ICD-10-CM | POA: Diagnosis not present

## 2017-09-10 DIAGNOSIS — L299 Pruritus, unspecified: Secondary | ICD-10-CM | POA: Diagnosis not present

## 2017-09-10 DIAGNOSIS — J4520 Mild intermittent asthma, uncomplicated: Secondary | ICD-10-CM | POA: Diagnosis not present

## 2017-10-13 DIAGNOSIS — E039 Hypothyroidism, unspecified: Secondary | ICD-10-CM | POA: Diagnosis not present

## 2017-10-13 DIAGNOSIS — M545 Low back pain: Secondary | ICD-10-CM | POA: Diagnosis not present

## 2017-11-01 DIAGNOSIS — M79672 Pain in left foot: Secondary | ICD-10-CM | POA: Diagnosis not present

## 2017-11-01 DIAGNOSIS — M545 Low back pain: Secondary | ICD-10-CM | POA: Diagnosis not present

## 2017-11-08 DIAGNOSIS — M79672 Pain in left foot: Secondary | ICD-10-CM | POA: Diagnosis not present

## 2017-11-08 DIAGNOSIS — M545 Low back pain: Secondary | ICD-10-CM | POA: Diagnosis not present

## 2017-12-22 DIAGNOSIS — Z23 Encounter for immunization: Secondary | ICD-10-CM | POA: Diagnosis not present

## 2017-12-31 DIAGNOSIS — J069 Acute upper respiratory infection, unspecified: Secondary | ICD-10-CM | POA: Diagnosis not present

## 2018-01-12 ENCOUNTER — Encounter (INDEPENDENT_AMBULATORY_CARE_PROVIDER_SITE_OTHER): Payer: BLUE CROSS/BLUE SHIELD

## 2018-05-03 DIAGNOSIS — R2681 Unsteadiness on feet: Secondary | ICD-10-CM | POA: Diagnosis not present

## 2018-05-03 DIAGNOSIS — R350 Frequency of micturition: Secondary | ICD-10-CM | POA: Diagnosis not present

## 2018-05-03 DIAGNOSIS — J4520 Mild intermittent asthma, uncomplicated: Secondary | ICD-10-CM | POA: Diagnosis not present

## 2018-05-03 DIAGNOSIS — E039 Hypothyroidism, unspecified: Secondary | ICD-10-CM | POA: Diagnosis not present

## 2018-11-02 DIAGNOSIS — G4733 Obstructive sleep apnea (adult) (pediatric): Secondary | ICD-10-CM | POA: Diagnosis not present

## 2018-11-02 DIAGNOSIS — J4520 Mild intermittent asthma, uncomplicated: Secondary | ICD-10-CM | POA: Diagnosis not present

## 2018-11-02 DIAGNOSIS — E039 Hypothyroidism, unspecified: Secondary | ICD-10-CM | POA: Diagnosis not present

## 2018-11-02 DIAGNOSIS — F418 Other specified anxiety disorders: Secondary | ICD-10-CM | POA: Diagnosis not present

## 2018-11-22 DIAGNOSIS — G4733 Obstructive sleep apnea (adult) (pediatric): Secondary | ICD-10-CM | POA: Diagnosis not present

## 2018-11-28 DIAGNOSIS — E039 Hypothyroidism, unspecified: Secondary | ICD-10-CM | POA: Diagnosis not present

## 2018-11-28 DIAGNOSIS — Z23 Encounter for immunization: Secondary | ICD-10-CM | POA: Diagnosis not present

## 2018-11-30 DIAGNOSIS — G4733 Obstructive sleep apnea (adult) (pediatric): Secondary | ICD-10-CM | POA: Diagnosis not present

## 2018-12-30 DIAGNOSIS — G4733 Obstructive sleep apnea (adult) (pediatric): Secondary | ICD-10-CM | POA: Diagnosis not present

## 2019-01-23 DIAGNOSIS — M25561 Pain in right knee: Secondary | ICD-10-CM | POA: Insufficient documentation

## 2019-01-24 DIAGNOSIS — Z6841 Body Mass Index (BMI) 40.0 and over, adult: Secondary | ICD-10-CM | POA: Diagnosis not present

## 2019-01-24 DIAGNOSIS — M25561 Pain in right knee: Secondary | ICD-10-CM | POA: Diagnosis not present

## 2019-01-24 DIAGNOSIS — M5136 Other intervertebral disc degeneration, lumbar region: Secondary | ICD-10-CM | POA: Diagnosis not present

## 2019-01-24 DIAGNOSIS — M545 Low back pain, unspecified: Secondary | ICD-10-CM | POA: Insufficient documentation

## 2019-01-30 DIAGNOSIS — G4733 Obstructive sleep apnea (adult) (pediatric): Secondary | ICD-10-CM | POA: Diagnosis not present

## 2019-01-31 DIAGNOSIS — M545 Low back pain, unspecified: Secondary | ICD-10-CM | POA: Insufficient documentation

## 2019-02-22 DIAGNOSIS — G4733 Obstructive sleep apnea (adult) (pediatric): Secondary | ICD-10-CM | POA: Diagnosis not present

## 2019-02-23 DIAGNOSIS — M47816 Spondylosis without myelopathy or radiculopathy, lumbar region: Secondary | ICD-10-CM | POA: Insufficient documentation

## 2019-02-23 DIAGNOSIS — M51369 Other intervertebral disc degeneration, lumbar region without mention of lumbar back pain or lower extremity pain: Secondary | ICD-10-CM | POA: Insufficient documentation

## 2019-02-23 DIAGNOSIS — M1711 Unilateral primary osteoarthritis, right knee: Secondary | ICD-10-CM | POA: Insufficient documentation

## 2019-03-01 DIAGNOSIS — G4733 Obstructive sleep apnea (adult) (pediatric): Secondary | ICD-10-CM | POA: Diagnosis not present

## 2019-03-08 DIAGNOSIS — G4733 Obstructive sleep apnea (adult) (pediatric): Secondary | ICD-10-CM | POA: Diagnosis not present

## 2019-06-28 DIAGNOSIS — E039 Hypothyroidism, unspecified: Secondary | ICD-10-CM | POA: Diagnosis not present

## 2019-06-28 DIAGNOSIS — F418 Other specified anxiety disorders: Secondary | ICD-10-CM | POA: Diagnosis not present

## 2019-06-28 DIAGNOSIS — J4520 Mild intermittent asthma, uncomplicated: Secondary | ICD-10-CM | POA: Diagnosis not present

## 2019-06-30 DIAGNOSIS — G4733 Obstructive sleep apnea (adult) (pediatric): Secondary | ICD-10-CM | POA: Diagnosis not present

## 2019-07-30 DIAGNOSIS — G4733 Obstructive sleep apnea (adult) (pediatric): Secondary | ICD-10-CM | POA: Diagnosis not present

## 2019-08-30 DIAGNOSIS — G4733 Obstructive sleep apnea (adult) (pediatric): Secondary | ICD-10-CM | POA: Diagnosis not present

## 2019-08-31 DIAGNOSIS — L290 Pruritus ani: Secondary | ICD-10-CM | POA: Diagnosis not present

## 2019-09-29 DIAGNOSIS — G4733 Obstructive sleep apnea (adult) (pediatric): Secondary | ICD-10-CM | POA: Diagnosis not present

## 2019-11-10 DIAGNOSIS — G4733 Obstructive sleep apnea (adult) (pediatric): Secondary | ICD-10-CM | POA: Diagnosis not present

## 2020-01-02 ENCOUNTER — Ambulatory Visit: Payer: Self-pay

## 2020-01-26 ENCOUNTER — Ambulatory Visit: Payer: Self-pay | Attending: Internal Medicine

## 2020-01-26 DIAGNOSIS — Z23 Encounter for immunization: Secondary | ICD-10-CM

## 2020-01-26 NOTE — Progress Notes (Signed)
   Covid-19 Vaccination Clinic  Name:  Kristi Ortiz    MRN: [id number] DOB: Sep 10, 1970  01/26/2020  Kristi Ortiz was observed post Covid-19 immunization for 15 minutes without incident. She was provided with Vaccine Information Sheet and instruction to access the V-Safe system.   Kristi Ortiz was instructed to call 911 with any severe reactions post vaccine: Marland Kitchen Difficulty breathing  . Swelling of face and throat  . A fast heartbeat  . A bad rash all over body  . Dizziness and weakness

## 2021-01-22 ENCOUNTER — Encounter: Payer: Self-pay | Admitting: Emergency Medicine

## 2021-01-22 ENCOUNTER — Ambulatory Visit (INDEPENDENT_AMBULATORY_CARE_PROVIDER_SITE_OTHER): Payer: Self-pay

## 2021-01-22 ENCOUNTER — Other Ambulatory Visit: Payer: Self-pay

## 2021-01-22 ENCOUNTER — Ambulatory Visit
Admission: EM | Admit: 2021-01-22 | Discharge: 2021-01-22 | Disposition: A | Discharge status: Home / Self Care | Payer: Self-pay | Attending: Internal Medicine | Admitting: Internal Medicine

## 2021-01-22 DIAGNOSIS — R059 Cough, unspecified: Secondary | ICD-10-CM

## 2021-01-22 DIAGNOSIS — R053 Chronic cough: Secondary | ICD-10-CM

## 2021-01-22 DIAGNOSIS — B9789 Other viral agents as the cause of diseases classified elsewhere: Secondary | ICD-10-CM

## 2021-01-22 DIAGNOSIS — J988 Other specified respiratory disorders: Secondary | ICD-10-CM

## 2021-01-22 MED ORDER — BENZONATATE 100 MG PO CAPS: 100.0000 mg | ORAL_CAPSULE | Freq: Three times a day (TID) | ORAL | 0 refills | Status: DC | PRN

## 2021-01-22 MED ORDER — PREDNISONE 20 MG PO TABS: 40.0000 mg | ORAL_TABLET | Freq: Every day | ORAL | 0 refills | Status: AC

## 2021-01-22 NOTE — Discharge Instructions (Signed)
Your chest x-ray did not show any signs of pneumonia.  You are prescribed prednisone steroid to decrease inflammation in chest and a cough medication.  Please follow-up if symptoms persist.

## 2021-01-22 NOTE — ED Provider Notes (Signed)
[facility]    CSN: [id number] Arrival date & time: 01/22/21  1117      History   Chief Complaint Chief Complaint  Patient presents with   Cough    HPI Kristi Ortiz is a 50 y.o. female.   Patient presents with productive cough that has been present for 2.5 weeks.  Patient has also had runny nose and nasal congestion.  Cough is productive with green to yellow sputum.  Denies any known fevers or sick contacts.  Denies chest pain, but the patient has had some intermittent shortness of breath.  Patient does have history of asthma.  Denies nausea, vomiting, diarrhea, abdominal pain.   Cough  Past Medical History:  Diagnosis Date   Allergy    Arthritis    Asthma Dx1990   Depression    Hypothyroid Dx 1994   Sleep apnea DX 2012   Sleep apnea    Sleep apnea    Sleep apnea    Vitamin D insufficiency 10/26/2013    Patient Active Problem List   Diagnosis Date Noted   Cognitive impairment 05/16/2015   GAD (generalized anxiety disorder) 05/16/2015   MDD (major depressive disorder) 05/16/2015   Severe obesity (BMI >= 40) (HCC) 02/07/2014   IFG (impaired fasting glucose) 10/26/2013   Allergic rhinitis 12/15/2012   Cerumen impaction 12/15/2012   Hypothyroidism 09/01/2012   OSA (obstructive sleep apnea) 09/01/2012   Memory difficulty 09/01/2012   Asthma 09/01/2012   Edema 09/01/2012    Past Surgical History:  Procedure Laterality Date   DENTAL SURGERY  2015   Took removal    OB History   No obstetric history on file.      Home Medications    Prior to Admission medications   Medication Sig Start Date End Date Taking? Authorizing Provider  benzonatate (TESSALON) 100 MG capsule Take 1 capsule (100 mg total) by mouth every 8 (eight) hours as needed for cough. 01/22/21  Yes Keiondre Colee, Rolly Salter E, FNP  predniSONE (DELTASONE) 20 MG tablet Take 2 tablets (40 mg total) by mouth daily for 5 days. 01/22/21 01/27/21 Yes Sura Canul, Acie Fredrickson, FNP  acetaminophen (TYLENOL) 325  MG tablet Take 650 mg by mouth every 6 (six) hours as needed.    [provider]  cetirizine (ZYRTEC) 10 MG chewable tablet Chew 1 tablet (10 mg total) by mouth daily. 12/06/15   English, Judeth Cornfield D, PA  COCONUT OIL PO Take by mouth.    [provider]  GINKGO BILOBA PO Take by mouth.    [provider]  hydrochlorothiazide (MICROZIDE) 12.5 MG capsule Take 1 capsule (12.5 mg total) by mouth daily. Patient not taking: Reported on 01/21/2016 05/16/15   Dorna Leitz, PA-C  levothyroxine (SYNTHROID, LEVOTHROID) 125 MCG tablet TAKE ONE TABLET BY MOUTH ONCE DAILY BEFORE BREAKFAST 02/18/16   Trena Platt D, PA  Multiple Vitamin (MULTIVITAMIN) tablet Take 1 tablet by mouth daily.    [provider]  PROAIR HFA 108 [phone] Base) MCG/ACT inhaler INHALE TWO PUFFS BY MOUTH EVERY 4 HOURS AS NEEDED FOR WHEEZING AND FOR SHORTNESS OF BREATH AND FOR COUGH 06/19/16   Shade Flood, MD    Family History Family History  Problem Relation Age of Onset   Diabetes Mother    Hypertension Mother    Psoriasis Father    Healthy Brother    Healthy Sister    Hyperlipidemia Sister    Hypertension Sister    Mental illness Maternal Grandmother    Cancer Maternal Grandfather  Mental illness Maternal Grandfather    Stroke Maternal Grandfather    Cancer Paternal Grandfather     Social History Social History   Tobacco Use   Smoking status: Never   Smokeless tobacco: Never  Substance Use Topics   Alcohol use: Yes    Comment: rare   Drug use: No     Allergies   Mold extract [trichophyton]   Review of Systems Review of Systems Per HPI  Physical Exam Triage Vital Signs ED Triage Vitals  Enc Vitals Group     BP 01/22/21 1401 124/85     Pulse Rate 01/22/21 1401 72     Resp 01/22/21 1401 16     Temp 01/22/21 1401 (!) 97.5 F (36.4 C)     Temp Source 01/22/21 1401 Oral     SpO2 01/22/21 1401 96 %     Weight --      Height --      Head Circumference --       Peak Flow --      Pain Score 01/22/21 1403 4     Pain Loc --      Pain Edu? --      Excl. in GC? --    No data found.  Updated Vital Signs BP 124/85 (BP Location: Left Arm)   Pulse 72   Temp (!) 97.5 F (36.4 C) (Oral)   Resp 16   SpO2 96%   Visual Acuity Right Eye Distance:   Left Eye Distance:   Bilateral Distance:    Right Eye Near:   Left Eye Near:    Bilateral Near:     Physical Exam Constitutional:      General: She is not in acute distress.    Appearance: Normal appearance. She is not toxic-appearing or diaphoretic.  HENT:     Head: Normocephalic and atraumatic.     Right Ear: Tympanic membrane and ear canal normal.     Left Ear: Tympanic membrane and ear canal normal.     Nose: Congestion present.     Mouth/Throat:     Mouth: Mucous membranes are moist.     Pharynx: No posterior oropharyngeal erythema.  Eyes:     Extraocular Movements: Extraocular movements intact.     Conjunctiva/sclera: Conjunctivae normal.     Pupils: Pupils are equal, round, and reactive to light.  Cardiovascular:     Rate and Rhythm: Normal rate and regular rhythm.     Pulses: Normal pulses.     Heart sounds: Normal heart sounds.  Pulmonary:     Effort: Pulmonary effort is normal. No respiratory distress.     Breath sounds: Normal breath sounds. No stridor. No wheezing, rhonchi or rales.  Abdominal:     General: Abdomen is flat. Bowel sounds are normal.     Palpations: Abdomen is soft.  Musculoskeletal:        General: Normal range of motion.     Cervical back: Normal range of motion.  Skin:    General: Skin is warm and dry.  Neurological:     General: No focal deficit present.     Mental Status: She is alert and oriented to person, place, and time. Mental status is at baseline.  Psychiatric:        Mood and Affect: Mood normal.        Behavior: Behavior normal.     UC Treatments / Results  Labs (all labs ordered are listed, but only abnormal results are displayed) Labs  Reviewed - No data to display  EKG   Radiology DG Chest 2 View  Result Date: 01/22/2021 CLINICAL DATA:  Cough for 2 weeks EXAM: CHEST - 2 VIEW COMPARISON:  04/04/2009 FINDINGS: Normal mediastinum and cardiac silhouette. Chronic central bronchitic markings. Normal pulmonary vasculature. No effusion, infiltrate, or pneumothorax. IMPRESSION: Chronic bronchitic markings.  No acute findings Electronically Signed   By: Genevive Bi M.D.   On: 01/22/2021 15:00    Procedures Procedures (including critical care time)  Medications Ordered in UC Medications - No data to display  Initial Impression / Assessment and Plan / UC Course  I have reviewed the triage vital signs and the nursing notes.  Pertinent labs & imaging results that were available during my care of the patient were reviewed by me and considered in my medical decision making (see chart for details).     Chest x-ray was negative for any acute cardiopulmonary process but did show chronic signs of bronchitis.  Will treat with prednisone steroid x5 days and benzonatate to take as needed for cough.  Suspect viral cause to symptoms.  Do not think that COVID-19 testing is necessary given duration of symptoms.  Advised patient to go to the hospital if shortness of breath worsens.  Patient is nontoxic-appearing and not in respiratory distress so do not think patient is in immediate need of medical attention at the hospital at this time.  Discussed return precautions.  Patient verbalized understanding and was agreeable with plan. Final Clinical Impressions(s) / UC Diagnoses   Final diagnoses:  Persistent cough  Viral respiratory infection     Discharge Instructions      Your chest x-ray did not show any signs of pneumonia.  You are prescribed prednisone steroid to decrease inflammation in chest and a cough medication.  Please follow-up if symptoms persist.     ED Prescriptions     Medication Sig Dispense Auth. Provider    predniSONE (DELTASONE) 20 MG tablet Take 2 tablets (40 mg total) by mouth daily for 5 days. 10 tablet [place], [place], [place]   benzonatate (TESSALON) 100 MG capsule Take 1 capsule (100 mg total) by mouth every 8 (eight) hours as needed for cough. 21 capsule [place], Acie Fredrickson, [place]      PDMP not reviewed this encounter.   Gustavus Bryant, [place] 01/22/21 1537

## 2021-01-22 NOTE — ED Triage Notes (Signed)
Cough x 2.5 weeks. Productive, green/yellow mucus. Denies fever. Hx of asthma. Reports runny nose, nasal congestion. Denies sore throat.

## 2021-04-18 NOTE — Progress Notes (Deleted)
°  Subjective:    Kristi Ortiz - 51 y.o. female MRN [id number]  Date of birth: 13-Apr-1970  HPI  [name] is to establish care.  Current issues and/or concerns:  ROS per HPI     Health Maintenance:  Health Maintenance Due  Topic Date Due   Hepatitis C Screening  Never done   PAP SMEAR-Modifier  12/16/2015   COLONOSCOPY (Pts 45-39yrs Insurance coverage will need to be confirmed)  Never done   COVID-19 Vaccine (2 - Pfizer series) 02/16/2020   INFLUENZA VACCINE  10/14/2020     Past Medical History: Patient Active Problem List   Diagnosis Date Noted   Cognitive impairment 05/16/2015   GAD (generalized anxiety disorder) 05/16/2015   MDD (major depressive disorder) 05/16/2015   Severe obesity (BMI >= 40) (HCC) 02/07/2014   IFG (impaired fasting glucose) 10/26/2013   Allergic rhinitis 12/15/2012   Cerumen impaction 12/15/2012   Hypothyroidism 09/01/2012   OSA (obstructive sleep apnea) 09/01/2012   Memory difficulty 09/01/2012   Asthma 09/01/2012   Edema 09/01/2012      Social History   reports that she has never smoked. She has never used smokeless tobacco. She reports current alcohol use. She reports that she does not use drugs.   Family History  family history includes Cancer in her maternal grandfather and paternal grandfather; Diabetes in her mother; Healthy in her brother and sister; Hyperlipidemia in her sister; Hypertension in her mother and sister; Mental illness in her maternal grandfather and maternal grandmother; Psoriasis in her father; Stroke in her maternal grandfather.   Medications: reviewed and updated   Objective:   Physical Exam There were no vitals taken for this visit. Physical Exam      Assessment & Plan:         Patient was given clear instructions to go to Emergency Department or return to medical center if symptoms don't improve, worsen, or new problems develop.The patient verbalized understanding.  I discussed the assessment  and treatment plan with the patient. The patient was provided an opportunity to ask questions and all were answered. The patient agreed with the plan and demonstrated an understanding of the instructions.   The patient was advised to call back or seek an in-person evaluation if the symptoms worsen or if the condition fails to improve as anticipated.    Ricky Stabs, NP 04/18/2021, 8:22 AM Primary Care at [facility]

## 2021-04-23 ENCOUNTER — Ambulatory Visit: Payer: Self-pay | Admitting: Family

## 2021-04-23 DIAGNOSIS — Z7689 Persons encountering health services in other specified circumstances: Secondary | ICD-10-CM

## 2022-01-31 ENCOUNTER — Other Ambulatory Visit: Payer: Self-pay

## 2022-01-31 ENCOUNTER — Emergency Department (HOSPITAL_COMMUNITY): Payer: 59

## 2022-01-31 ENCOUNTER — Emergency Department (HOSPITAL_COMMUNITY)
Admission: EM | Admit: 2022-01-31 | Discharge: 2022-01-31 | Disposition: A | Discharge status: Home / Self Care | Payer: 59 | Attending: Emergency Medicine | Admitting: Emergency Medicine

## 2022-01-31 ENCOUNTER — Ambulatory Visit
Admission: EM | Admit: 2022-01-31 | Discharge: 2022-01-31 | Disposition: A | Discharge status: Home / Self Care | Payer: 59

## 2022-01-31 DIAGNOSIS — J45909 Unspecified asthma, uncomplicated: Secondary | ICD-10-CM | POA: Diagnosis not present

## 2022-01-31 DIAGNOSIS — J4541 Moderate persistent asthma with (acute) exacerbation: Secondary | ICD-10-CM

## 2022-01-31 DIAGNOSIS — J101 Influenza due to other identified influenza virus with other respiratory manifestations: Secondary | ICD-10-CM | POA: Diagnosis not present

## 2022-01-31 DIAGNOSIS — R7981 Abnormal blood-gas level: Secondary | ICD-10-CM

## 2022-01-31 DIAGNOSIS — J069 Acute upper respiratory infection, unspecified: Secondary | ICD-10-CM | POA: Diagnosis not present

## 2022-01-31 DIAGNOSIS — R0602 Shortness of breath: Secondary | ICD-10-CM | POA: Diagnosis present

## 2022-01-31 DIAGNOSIS — Z20822 Contact with and (suspected) exposure to covid-19: Secondary | ICD-10-CM | POA: Diagnosis not present

## 2022-01-31 LAB — CBC WITH DIFFERENTIAL/PLATELET
Abs Immature Granulocytes: 0.02 10*3/uL (ref 0.00–0.07)
Basophils Absolute: 0 10*3/uL (ref 0.0–0.1)
Basophils Relative: 0 %
Eosinophils Absolute: 0 10*3/uL (ref 0.0–0.5)
Eosinophils Relative: 0 %
HCT: 40.4 % (ref 36.0–46.0)
Hemoglobin: 13 g/dL (ref 12.0–15.0)
Immature Granulocytes: 0 %
Lymphocytes Relative: 25 %
Lymphs Abs: 1.3 10*3/uL (ref 0.7–4.0)
MCH: 28.6 pg (ref 26.0–34.0)
MCHC: 32.2 g/dL (ref 30.0–36.0)
MCV: 89 fL (ref 80.0–100.0)
Monocytes Absolute: 1 10*3/uL (ref 0.1–1.0)
Monocytes Relative: 18 %
Neutro Abs: 2.9 10*3/uL (ref 1.7–7.7)
Neutrophils Relative %: 57 %
Platelets: 240 10*3/uL (ref 150–400)
RBC: 4.54 MIL/uL (ref 3.87–5.11)
RDW: 14.4 % (ref 11.5–15.5)
WBC: 5.3 10*3/uL (ref 4.0–10.5)
nRBC: 0 % (ref 0.0–0.2)

## 2022-01-31 LAB — BASIC METABOLIC PANEL
Anion gap: 12 (ref 5–15)
BUN: 9 mg/dL (ref 6–20)
CO2: 23 mmol/L (ref 22–32)
Calcium: 8.4 mg/dL — ABNORMAL LOW (ref 8.9–10.3)
Chloride: 103 mmol/L (ref 98–111)
Creatinine, Ser: 0.68 mg/dL (ref 0.44–1.00)
GFR, Estimated: >60 mL/min (ref >60)
Glucose, Bld: 105 mg/dL — ABNORMAL HIGH (ref 70–99)
Potassium: 3.8 mmol/L (ref 3.5–5.1)
Sodium: 138 mmol/L (ref 135–145)

## 2022-01-31 LAB — RESP PANEL BY RT-PCR (RSV, FLU A&B, COVID)  RVPGX2
Influenza A by PCR: POSITIVE — AB
Influenza B by PCR: NEGATIVE
Resp Syncytial Virus by PCR: NEGATIVE
SARS Coronavirus 2 by RT PCR: NEGATIVE

## 2022-01-31 MED ORDER — IPRATROPIUM-ALBUTEROL 0.5-2.5 (3) MG/3ML IN SOLN
3.0000 mL | Freq: Once | RESPIRATORY_TRACT | Status: AC
  Administered 2022-01-31: 3 mL via RESPIRATORY_TRACT
  Filled 2022-01-31: qty 3

## 2022-01-31 MED ORDER — OSELTAMIVIR PHOSPHATE 75 MG PO CAPS: 75.0000 mg | ORAL_CAPSULE | Freq: Two times a day (BID) | ORAL | 0 refills | Status: DC

## 2022-01-31 MED ORDER — IPRATROPIUM-ALBUTEROL 0.5-2.5 (3) MG/3ML IN SOLN
3.0000 mL | Freq: Once | RESPIRATORY_TRACT | Status: AC
  Administered 2022-01-31: 3 mL via RESPIRATORY_TRACT

## 2022-01-31 MED ORDER — GUAIFENESIN-DM 100-10 MG/5ML PO SYRP
15.0000 mL | ORAL_SOLUTION | Freq: Once | ORAL | Status: AC
  Administered 2022-01-31: 15 mL via ORAL
  Filled 2022-01-31: qty 15

## 2022-01-31 MED ORDER — ALBUTEROL SULFATE HFA 108 (90 BASE) MCG/ACT IN AERS: 1.0000 | INHALATION_SPRAY | Freq: Four times a day (QID) | RESPIRATORY_TRACT | 0 refills | Status: DC | PRN

## 2022-01-31 MED ORDER — METHYLPREDNISOLONE SODIUM SUCC 125 MG IJ SOLR
125.0000 mg | Freq: Once | INTRAMUSCULAR | Status: AC
  Administered 2022-01-31: 125 mg via INTRAVENOUS
  Filled 2022-01-31: qty 2

## 2022-01-31 NOTE — ED Triage Notes (Signed)
Pt presents to uc with ha, nausea, weakness, fatigue, cough and hoarseness, cva tenderness since Thursday. Pt report fevers off and on.  Pt denies dysuria.

## 2022-01-31 NOTE — ED Triage Notes (Signed)
Patient sent to ED for further evaluation of shortness of breath, diarrhea, and voice hoarseness that started Thursday this week. Room air sat SpO2 94%, patient speaking in complete sentences, is alert, oriented, and in no apparent distress at this time.

## 2022-01-31 NOTE — ED Provider Notes (Signed)
[facility] EMERGENCY DEPARTMENT Provider Note   CSN: [id number] Arrival date & time: 01/31/22  1416     History  Chief Complaint  Patient presents with   Shortness of Breath    Kristi Ortiz is a 51 y.o. female.  Patient is a 51 yo female presenting for sob, dry coughing, lethargy, generalized weakness, and voice changes since Thursday 01/29/22, 2 days. Admits to hx of asthma. Admits to wheezing. Denies chest pain. Never smoker. Denies fevers or chills. Admits nausea and diarrhea without abdominal pain.   The history is provided by the patient. No language interpreter was used.  Shortness of Breath Associated symptoms: cough   Associated symptoms: no abdominal pain, no chest pain, no ear pain, no fever, no rash, no sore throat and no vomiting        Home Medications Prior to Admission medications   Medication Sig Start Date End Date Taking? Authorizing Provider  albuterol (VENTOLIN HFA) 108 (90 Base) MCG/ACT inhaler Inhale 1-2 puffs into the lungs every 6 (six) hours as needed for wheezing or shortness of breath. 01/31/22  Yes Edwin Dada P, DO  oseltamivir (TAMIFLU) 75 MG capsule Take 1 capsule (75 mg total) by mouth every 12 (twelve) hours. 01/31/22  Yes Edwin Dada P, DO  acetaminophen (TYLENOL) 325 MG tablet Take 650 mg by mouth every 6 (six) hours as needed.    [provider]  benzonatate (TESSALON) 100 MG capsule Take 1 capsule (100 mg total) by mouth every 8 (eight) hours as needed for cough. 01/22/21   Gustavus Bryant, FNP  cetirizine (ZYRTEC) 10 MG chewable tablet Chew 1 tablet (10 mg total) by mouth daily. 12/06/15   Trena Platt D, PA  clobetasol ointment (TEMOVATE) 0.05 % Apply topically 2 (two) times daily. 01/27/22   [provider]  COCONUT OIL PO Take by mouth.    [provider]  GINKGO BILOBA PO Take by mouth.    [provider]  hydrochlorothiazide (MICROZIDE) 12.5 MG capsule Take 1 capsule (12.5  mg total) by mouth daily. Patient not taking: Reported on 01/21/2016 05/16/15   Dorna Leitz, PA-C  levothyroxine (SYNTHROID) 150 MCG tablet Take 150 mcg by mouth every morning. 01/22/22   [provider]  levothyroxine (SYNTHROID, LEVOTHROID) 125 MCG tablet TAKE ONE TABLET BY MOUTH ONCE DAILY BEFORE BREAKFAST 02/18/16   English, Stephanie D, PA  montelukast (SINGULAIR) 10 MG tablet TAKE 1 TABLET BY MOUTH ONCE DAILY FOR 30 DAYS    [provider]  Multiple Vitamin (MULTIVITAMIN) tablet Take 1 tablet by mouth daily.    [provider]      Allergies    Mold extract [trichophyton]    Review of Systems   Review of Systems  Constitutional:  Positive for fatigue. Negative for chills and fever.  HENT:  Negative for ear pain and sore throat.   Eyes:  Negative for pain and visual disturbance.  Respiratory:  Positive for cough and shortness of breath.   Cardiovascular:  Negative for chest pain and palpitations.  Gastrointestinal:  Positive for diarrhea and nausea. Negative for abdominal pain and vomiting.  Genitourinary:  Negative for dysuria and hematuria.  Musculoskeletal:  Negative for arthralgias and back pain.  Skin:  Negative for color change and rash.  Neurological:  Negative for seizures and syncope.  All other systems reviewed and are negative.   Physical Exam Updated Vital Signs BP 113/72   Pulse 81   Temp 99.7 F (37.6 C) (Oral)  Resp 18   LMP 12/25/2015 (Approximate)   SpO2 100%  Physical Exam Vitals and nursing note reviewed.  Constitutional:      General: She is not in acute distress.    Appearance: She is well-developed.  HENT:     Head: Normocephalic and atraumatic.  Eyes:     Conjunctiva/sclera: Conjunctivae normal.  Cardiovascular:     Rate and Rhythm: Normal rate and regular rhythm.     Heart sounds: No murmur heard. Pulmonary:     Effort: Pulmonary effort is normal. No respiratory distress.     Breath sounds: Normal breath sounds.   Abdominal:     Palpations: Abdomen is soft.     Tenderness: There is no abdominal tenderness.  Musculoskeletal:        General: No swelling.     Cervical back: Neck supple.  Skin:    General: Skin is warm and dry.     Capillary Refill: Capillary refill takes less than 2 seconds.  Neurological:     Mental Status: She is alert.  Psychiatric:        Mood and Affect: Mood normal.     ED Results / Procedures / Treatments   Labs (all labs ordered are listed, but only abnormal results are displayed) Labs Reviewed  RESP PANEL BY RT-PCR (RSV, FLU A&B, COVID)  RVPGX2 - Abnormal; Notable for the following components:      Result Value   Influenza A by PCR POSITIVE (*)    All other components within normal limits  BASIC METABOLIC PANEL - Abnormal; Notable for the following components:   Glucose, Bld 105 (*)    Calcium 8.4 (*)    All other components within normal limits  CBC WITH DIFFERENTIAL/PLATELET    EKG None  Radiology DG Chest 2 View  Result Date: 01/31/2022 CLINICAL DATA:  Shortness of breath EXAM: CHEST - 2 VIEW COMPARISON:  01/22/2021 FINDINGS: Cardiac size is within normal limits. There are no signs of pulmonary edema or focal pulmonary consolidation. There is no pleural effusion or pneumothorax. IMPRESSION: There are no signs of pulmonary edema or focal pulmonary consolidation. Electronically Signed   By: Ernie Avena M.D.   On: 01/31/2022 15:19    Procedures Procedures    Medications Ordered in ED Medications  ipratropium-albuterol (DUONEB) 0.5-2.5 (3) MG/3ML nebulizer solution 3 mL (3 mLs Nebulization Given 01/31/22 1751)  methylPREDNISolone sodium succinate (SOLU-MEDROL) 125 mg/2 mL injection 125 mg (125 mg Intravenous Given 01/31/22 1752)  guaiFENesin-dextromethorphan (ROBITUSSIN DM) 100-10 MG/5ML syrup 15 mL (15 mLs Oral Given 01/31/22 1750)    ED Course/ Medical Decision Making/ A&P                           Medical Decision Making Amount and/or  Complexity of Data Reviewed Labs: ordered. Radiology: ordered.  Risk OTC drugs. Prescription drug management.   7:42 PM 51 yo female presenting for sob, coughing, lethargy, generalized weakness, and voice changes since Thursday 01/29/22, 2 days.  Is alert oriented x3, no acute distress, afebrile, stable vital signs.  On exam patient has no labored breathing, no tachypnea, no tachycardia.  She has some minimal wheezing in bilateral lung fields.  DuoNeb treatment given.  Pulse oximetry demonstrates no hypoxia.  No signs or symptoms of sepsis.  Ambulating pulse ox demonstrates no hypoxia.  Chest x-ray demonstrates no infiltrates.  PCR positive for influenza A.  Patient safe for discharge home with home albuterol prescription.  Recommended for  rest, increase hydration, and Tamiflu.  Patient in no distress and overall condition improved here in the ED. Detailed discussions were had with the patient regarding current findings, and need for close f/u with PCP or on call doctor. The patient has been instructed to return immediately if the symptoms worsen in any way for re-evaluation. Patient verbalized understanding and is in agreement with current care plan. All questions answered prior to discharge.         Final Clinical Impression(s) / ED Diagnoses Final diagnoses:  Influenza A    Rx / DC Orders ED Discharge Orders          Ordered    oseltamivir (TAMIFLU) 75 MG capsule  Every 12 hours        01/31/22 1942    albuterol (VENTOLIN HFA) 108 (90 Base) MCG/ACT inhaler  Every 6 hours PRN        01/31/22 1942              Franne Forts, DO 01/31/22 1942

## 2022-01-31 NOTE — ED Notes (Signed)
Patient is being discharged from the Urgent Care and sent to the Emergency Department via POV . Per Ervin Knack NP, patient is in need of higher level of care due to hypoxia. Patient is aware and verbalizes understanding of plan of care.  Vitals:   01/31/22 1256 01/31/22 1300  BP:    Pulse:    Resp:    Temp:    SpO2: 95% (!) 87%

## 2022-01-31 NOTE — ED Notes (Signed)
Patient ambulated around room, oxygen sats range from 95%-99% RA.

## 2022-01-31 NOTE — Discharge Instructions (Signed)
Go to the emergency department as soon as you leave urgent care for further evaluation and management. 

## 2022-01-31 NOTE — ED Provider Notes (Signed)
[facility]    CSN: [id number] Arrival date & time: 01/31/22  1031      History   Chief Complaint Chief Complaint  Patient presents with   Hoarse   Cough   Nasal Congestion   Weakness    HPI Kristi Ortiz is a 51 y.o. female.   Patient presents with headache, nausea without vomiting, weakness, fatigue, cough, nasal congestion that started 3 days ago.  Denies any known sick contacts or fevers at home.  Patient reports that she has felt feverish but has not had any documented fevers at home.  Patient has history of asthma and had to use albuterol inhaler yesterday but has not had to use it today.  Patient endorses some intermittent shortness of breath.  Denies chest pain, sore throat, ear pain, vomiting, diarrhea, abdominal pain.  Patient reported to nursing staff that she was having some CVA tenderness but denies this to me during physical exam.  Patient does report that she had some urinary frequency over the past 3 weeks but has not been seen by healthcare provider for this.  Denies dysuria, vaginal discharge, hematuria, abdominal is oxygen saturation pain, back pain.   Cough Weakness   Past Medical History:  Diagnosis Date   Allergy    Arthritis    Asthma Dx1990   Depression    Hypothyroid Dx 1994   Sleep apnea DX 2012   Sleep apnea    Sleep apnea    Sleep apnea    Vitamin D insufficiency 10/26/2013    Patient Active Problem List   Diagnosis Date Noted   Cognitive impairment 05/16/2015   GAD (generalized anxiety disorder) 05/16/2015   MDD (major depressive disorder) 05/16/2015   Severe obesity (BMI >= 40) (HCC) 02/07/2014   IFG (impaired fasting glucose) 10/26/2013   Allergic rhinitis 12/15/2012   Cerumen impaction 12/15/2012   Hypothyroidism 09/01/2012   OSA (obstructive sleep apnea) 09/01/2012   Memory difficulty 09/01/2012   Asthma 09/01/2012   Edema 09/01/2012    Past Surgical History:  Procedure Laterality Date   DENTAL SURGERY  2015    Took removal    OB History   No obstetric history on file.      Home Medications    Prior to Admission medications   Medication Sig Start Date End Date Taking? Authorizing Provider  clobetasol ointment (TEMOVATE) 0.05 % Apply topically 2 (two) times daily. 01/27/22  Yes [provider]  levothyroxine (SYNTHROID) 150 MCG tablet Take 150 mcg by mouth every morning. 01/22/22  Yes [provider]  acetaminophen (TYLENOL) 325 MG tablet Take 650 mg by mouth every 6 (six) hours as needed.    [provider]  benzonatate (TESSALON) 100 MG capsule Take 1 capsule (100 mg total) by mouth every 8 (eight) hours as needed for cough. 01/22/21   Gustavus Bryant, FNP  cetirizine (ZYRTEC) 10 MG chewable tablet Chew 1 tablet (10 mg total) by mouth daily. 12/06/15   English, Judeth Cornfield D, PA  COCONUT OIL PO Take by mouth.    [provider]  GINKGO BILOBA PO Take by mouth.    [provider]  hydrochlorothiazide (MICROZIDE) 12.5 MG capsule Take 1 capsule (12.5 mg total) by mouth daily. Patient not taking: Reported on 01/21/2016 05/16/15   Dorna Leitz, PA-C  levothyroxine (SYNTHROID, LEVOTHROID) 125 MCG tablet TAKE ONE TABLET BY MOUTH ONCE DAILY BEFORE BREAKFAST 02/18/16   English, Judeth Cornfield D, PA  montelukast (SINGULAIR) 10 MG tablet TAKE 1 TABLET BY MOUTH  ONCE DAILY FOR 30 DAYS    [provider]  Multiple Vitamin (MULTIVITAMIN) tablet Take 1 tablet by mouth daily.    [provider]  PROAIR HFA 108 [phone] Base) MCG/ACT inhaler INHALE TWO PUFFS BY MOUTH EVERY 4 HOURS AS NEEDED FOR WHEEZING AND FOR SHORTNESS OF BREATH AND FOR COUGH 06/19/16   Shade Flood, MD    Family History Family History  Problem Relation Age of Onset   Diabetes Mother    Hypertension Mother    Psoriasis Father    Healthy Brother    Healthy Sister    Hyperlipidemia Sister    Hypertension Sister    Mental illness Maternal Grandmother    Cancer Maternal Grandfather     Mental illness Maternal Grandfather    Stroke Maternal Grandfather    Cancer Paternal Grandfather     Social History Social History   Tobacco Use   Smoking status: Never   Smokeless tobacco: Never  Substance Use Topics   Alcohol use: Yes    Comment: rare   Drug use: No     Allergies   Mold extract [trichophyton]   Review of Systems Review of Systems Per HPI  Physical Exam Triage Vital Signs ED Triage Vitals  Enc Vitals Group     BP 01/31/22 1208 122/83     Pulse Rate 01/31/22 1208 89     Resp 01/31/22 1208 20     Temp 01/31/22 1208 97.8 F (36.6 C)     Temp src --      SpO2 01/31/22 1208 93 %     Weight --      Height --      Head Circumference --      Peak Flow --      Pain Score 01/31/22 1205 4     Pain Loc --      Pain Edu? --      Excl. in GC? --    No data found.  Updated Vital Signs BP 122/83   Pulse 89   Temp 97.8 F (36.6 C)   Resp 20   LMP  (Exact Date)   SpO2 (!) 87%   Visual Acuity Right Eye Distance:   Left Eye Distance:   Bilateral Distance:    Right Eye Near:   Left Eye Near:    Bilateral Near:     Physical Exam Constitutional:      General: She is not in acute distress.    Appearance: Normal appearance. She is not toxic-appearing or diaphoretic.  HENT:     Head: Normocephalic and atraumatic.     Right Ear: Tympanic membrane and ear canal normal.     Left Ear: Tympanic membrane and ear canal normal.     Nose: Congestion present.     Mouth/Throat:     Mouth: Mucous membranes are moist.     Pharynx: No posterior oropharyngeal erythema.  Eyes:     Extraocular Movements: Extraocular movements intact.     Conjunctiva/sclera: Conjunctivae normal.     Pupils: Pupils are equal, round, and reactive to light.  Cardiovascular:     Rate and Rhythm: Normal rate and regular rhythm.     Pulses: Normal pulses.     Heart sounds: Normal heart sounds.  Pulmonary:     Effort: Pulmonary effort is normal. No respiratory distress.      Breath sounds: Normal breath sounds. No stridor. No wheezing, rhonchi or rales.  Abdominal:     General: Abdomen is flat.  Bowel sounds are normal.     Palpations: Abdomen is soft.  Musculoskeletal:        General: Normal range of motion.     Cervical back: Normal range of motion.     Comments: No tenderness to palpation to back.   Skin:    General: Skin is warm and dry.  Neurological:     General: No focal deficit present.     Mental Status: She is alert and oriented to person, place, and time. Mental status is at baseline.  Psychiatric:        Mood and Affect: Mood normal.        Behavior: Behavior normal.      UC Treatments / Results  Labs (all labs ordered are listed, but only abnormal results are displayed) Labs Reviewed - No data to display  EKG   Radiology No results found.  Procedures Procedures (including critical care time)  Medications Ordered in UC Medications  ipratropium-albuterol (DUONEB) 0.5-2.5 (3) MG/3ML nebulizer solution 3 mL (3 mLs Nebulization Given 01/31/22 1243)    Initial Impression / Assessment and Plan / UC Course  I have reviewed the triage vital signs and the nursing notes.  Pertinent labs & imaging results that were available during my care of the patient were reviewed by me and considered in my medical decision making (see chart for details).     Patient's oxygen saturation was 93% on initial triage and physical exam.  Given patient has asthma, DuoNeb was administered in urgent care to see if improvement in oxygen saturation would occur.  After DuoNeb, patient stated that she felt mildly better but oxygen dropped to 87% and sustained.  Given this, I do think this warrants further evaluation and management at the hospital as I believe patient has a viral illness exacerbating asthma.  Patient was agreeable to going to the hospital.  Suggested EMS transport given oxygen saturation but patient declined.  Risks associated with not going by EMS  were discussed with patient.  Patient voiced understanding.  Patient wishes self transport to the ER.  Deferred urinary symptoms evaluation to ER given low oxygen saturation and priority of getting patient to the ER. Advised patient to notify ER of the symptoms.  Patient voiced understanding. Final Clinical Impressions(s) / UC Diagnoses   Final diagnoses:  Moderate persistent asthma with acute exacerbation  Viral upper respiratory tract infection with cough  Low oxygen saturation     Discharge Instructions      Go to the emergency department as soon as you leave urgent care for further evaluation and management.   ED Prescriptions   None    PDMP not reviewed this encounter.   Gustavus Bryant, [place] 01/31/22 1314

## 2022-07-21 NOTE — H&P (Signed)
Kristi Ortiz is an 52 y.o. No obstetric history on file. who is admitted for ***.  Patient Active Problem List   Diagnosis Date Noted   Cognitive impairment 05/16/2015   GAD (generalized anxiety disorder) 05/16/2015   MDD (major depressive disorder) 05/16/2015   Severe obesity (BMI >= 40) (HCC) 02/07/2014   IFG (impaired fasting glucose) 10/26/2013   Allergic rhinitis 12/15/2012   Cerumen impaction 12/15/2012   Hypothyroidism 09/01/2012   OSA (obstructive sleep apnea) 09/01/2012   Memory difficulty 09/01/2012   Asthma 09/01/2012   Edema 09/01/2012    Pertinent Gynecological History: Menses: {menses:16152} Bleeding: {uterine bleeding:32112} Contraception: {contraception:5051} Sexually transmitted diseases: {std risk:32110} Previous GYN Procedures: {previous procedures:3041388}  Last mammogram: {normal/abnormal***:32111} Date: *** Last pap: {normal/abnormal***:32111} Date: *** OB History: No obstetric history on file.   MEDICAL/FAMILY/SOCIAL HX: Patient's last menstrual period was 12/25/2015 (approximate).    Past Medical History:  Diagnosis Date   Allergy    Arthritis    Asthma Dx1990   Depression    Hypothyroid Dx 1994   Sleep apnea DX 2012   Sleep apnea    Sleep apnea    Sleep apnea    Vitamin D insufficiency 10/26/2013    Past Surgical History:  Procedure Laterality Date   DENTAL SURGERY  2015   Took removal    Family History  Problem Relation Age of Onset   Diabetes Mother    Hypertension Mother    Psoriasis Father    Healthy Brother    Healthy Sister    Hyperlipidemia Sister    Hypertension Sister    Mental illness Maternal Grandmother    Cancer Maternal Grandfather    Mental illness Maternal Grandfather    Stroke Maternal Grandfather    Cancer Paternal Grandfather     Social History:  reports that she has never smoked. She has never used smokeless tobacco. She reports current alcohol use. She reports that she does not use  drugs.  ALLERGIES/MEDS:  Allergies:  Allergies  Allergen Reactions   Mold Extract [Trichophyton]     Cold, asthma, occurs in spells    No medications prior to admission.     ROS  Last menstrual period 12/25/2015. Physical Exam  No results found for this or any previous visit (from the past 24 hour(s)).  No results found.   ASSESSMENT/PLAN: Kristi Ortiz is a 52 y.o. No obstetric history on file. who is admitted for ***   [name], DO

## 2022-07-31 ENCOUNTER — Encounter (HOSPITAL_COMMUNITY): Payer: Self-pay | Admitting: Obstetrics and Gynecology

## 2022-07-31 ENCOUNTER — Other Ambulatory Visit: Payer: Self-pay

## 2022-07-31 NOTE — Progress Notes (Signed)
Spoke with pt for pre-op call. Pt denies cardiac history, HTN or Diabetes. Pt is treated for Hypothyroidism.   Shower instructions given to [place] and she voiced understanding.

## 2022-08-03 NOTE — Progress Notes (Signed)
Pt called with questions about her surgery tomorrow. I spoke with her on Friday, 07/31/22 but she needed a few reminders. She thought she could not eat today except soft foods. I explained that she can eat like normal, but not to eat after midnight tonight. She may clear liquids until 4:30 AM. I verified which medications she could take in the AM. She voiced understanding.

## 2022-08-04 ENCOUNTER — Encounter (HOSPITAL_COMMUNITY): Payer: Self-pay | Admitting: Obstetrics and Gynecology

## 2022-08-04 ENCOUNTER — Ambulatory Visit (HOSPITAL_BASED_OUTPATIENT_CLINIC_OR_DEPARTMENT_OTHER): Payer: Medicaid Other | Admitting: Certified Registered"

## 2022-08-04 ENCOUNTER — Other Ambulatory Visit: Payer: Self-pay

## 2022-08-04 ENCOUNTER — Ambulatory Visit (HOSPITAL_COMMUNITY): Payer: Medicaid Other | Admitting: Certified Registered"

## 2022-08-04 ENCOUNTER — Encounter (HOSPITAL_COMMUNITY)
Admission: RE | Disposition: A | Discharge status: Home / Self Care | Payer: Self-pay | Source: Home / Self Care | Attending: Obstetrics and Gynecology

## 2022-08-04 ENCOUNTER — Ambulatory Visit (HOSPITAL_COMMUNITY)
Admission: RE | Admit: 2022-08-04 | Discharge: 2022-08-04 | Disposition: A | Discharge status: Home / Self Care | Payer: Medicaid Other | Attending: Obstetrics and Gynecology | Admitting: Obstetrics and Gynecology

## 2022-08-04 DIAGNOSIS — F418 Other specified anxiety disorders: Secondary | ICD-10-CM

## 2022-08-04 DIAGNOSIS — R9389 Abnormal findings on diagnostic imaging of other specified body structures: Secondary | ICD-10-CM

## 2022-08-04 DIAGNOSIS — G4730 Sleep apnea, unspecified: Secondary | ICD-10-CM | POA: Diagnosis not present

## 2022-08-04 DIAGNOSIS — Z79890 Hormone replacement therapy (postmenopausal): Secondary | ICD-10-CM | POA: Diagnosis not present

## 2022-08-04 DIAGNOSIS — Z6841 Body Mass Index (BMI) 40.0 and over, adult: Secondary | ICD-10-CM | POA: Insufficient documentation

## 2022-08-04 DIAGNOSIS — G4733 Obstructive sleep apnea (adult) (pediatric): Secondary | ICD-10-CM | POA: Diagnosis not present

## 2022-08-04 DIAGNOSIS — J45909 Unspecified asthma, uncomplicated: Secondary | ICD-10-CM | POA: Insufficient documentation

## 2022-08-04 DIAGNOSIS — E039 Hypothyroidism, unspecified: Secondary | ICD-10-CM | POA: Insufficient documentation

## 2022-08-04 DIAGNOSIS — N840 Polyp of corpus uteri: Secondary | ICD-10-CM | POA: Diagnosis not present

## 2022-08-04 DIAGNOSIS — Z8249 Family history of ischemic heart disease and other diseases of the circulatory system: Secondary | ICD-10-CM | POA: Insufficient documentation

## 2022-08-04 DIAGNOSIS — N911 Secondary amenorrhea: Secondary | ICD-10-CM | POA: Diagnosis not present

## 2022-08-04 DIAGNOSIS — D250 Submucous leiomyoma of uterus: Secondary | ICD-10-CM | POA: Diagnosis not present

## 2022-08-04 HISTORY — PX: DILATATION & CURETTAGE/HYSTEROSCOPY WITH MYOSURE: SHX6511

## 2022-08-04 HISTORY — DX: Anxiety disorder, unspecified: F41.9

## 2022-08-04 HISTORY — DX: Gastro-esophageal reflux disease without esophagitis: K21.9

## 2022-08-04 HISTORY — DX: Attention-deficit hyperactivity disorder, unspecified type: F90.9

## 2022-08-04 HISTORY — PX: HYSTEROSCOPY: SHX211

## 2022-08-04 LAB — CBC
HCT: 41.6 % (ref 36.0–46.0)
Hemoglobin: 13.3 g/dL (ref 12.0–15.0)
MCH: 27.8 pg (ref 26.0–34.0)
MCHC: 32 g/dL (ref 30.0–36.0)
MCV: 87 fL (ref 80.0–100.0)
Platelets: 311 10*3/uL (ref 150–400)
RBC: 4.78 MIL/uL (ref 3.87–5.11)
RDW: 14.6 % (ref 11.5–15.5)
WBC: 5.9 10*3/uL (ref 4.0–10.5)
nRBC: 0 % (ref 0.0–0.2)

## 2022-08-04 LAB — ABO/RH: ABO/RH(D): O POS

## 2022-08-04 LAB — TYPE AND SCREEN
ABO/RH(D): O POS
Antibody Screen: NEGATIVE

## 2022-08-04 SURGERY — DILATATION & CURETTAGE/HYSTEROSCOPY WITH MYOSURE
Anesthesia: General | Site: Vagina

## 2022-08-04 MED ORDER — EPHEDRINE SULFATE-NACL 50-0.9 MG/10ML-% IV SOSY
PREFILLED_SYRINGE | INTRAVENOUS | Status: DC | PRN
  Administered 2022-08-04 (×2): 5 mg via INTRAVENOUS

## 2022-08-04 MED ORDER — ROCURONIUM BROMIDE 10 MG/ML (PF) SYRINGE
PREFILLED_SYRINGE | INTRAVENOUS | Status: AC
  Filled 2022-08-04: qty 10

## 2022-08-04 MED ORDER — SODIUM CHLORIDE 0.9 % IR SOLN
Status: DC | PRN
  Administered 2022-08-04: 3000 mL

## 2022-08-04 MED ORDER — ACETAMINOPHEN 10 MG/ML IV SOLN
INTRAVENOUS | Status: DC | PRN
  Administered 2022-08-04: 1000 mg via INTRAVENOUS

## 2022-08-04 MED ORDER — ONDANSETRON HCL 4 MG/2ML IJ SOLN
INTRAMUSCULAR | Status: AC
  Filled 2022-08-04: qty 2

## 2022-08-04 MED ORDER — LACTATED RINGERS IV SOLN: INTRAVENOUS | Status: DC

## 2022-08-04 MED ORDER — ONDANSETRON HCL 4 MG/2ML IJ SOLN
INTRAMUSCULAR | Status: DC | PRN
  Administered 2022-08-04: 4 mg via INTRAVENOUS

## 2022-08-04 MED ORDER — MIDAZOLAM HCL 2 MG/2ML IJ SOLN
INTRAMUSCULAR | Status: AC
  Filled 2022-08-04: qty 2

## 2022-08-04 MED ORDER — FENTANYL CITRATE (PF) 250 MCG/5ML IJ SOLN
INTRAMUSCULAR | Status: AC
  Filled 2022-08-04: qty 5

## 2022-08-04 MED ORDER — DEXAMETHASONE SODIUM PHOSPHATE 10 MG/ML IJ SOLN
INTRAMUSCULAR | Status: AC
  Filled 2022-08-04: qty 1

## 2022-08-04 MED ORDER — KETOROLAC TROMETHAMINE 30 MG/ML IJ SOLN
INTRAMUSCULAR | Status: DC | PRN
  Administered 2022-08-04: 30 mg via INTRAVENOUS

## 2022-08-04 MED ORDER — PROPOFOL 10 MG/ML IV BOLUS
INTRAVENOUS | Status: DC | PRN
  Administered 2022-08-04: 140 mg via INTRAVENOUS
  Administered 2022-08-04: 60 mg via INTRAVENOUS

## 2022-08-04 MED ORDER — LIDOCAINE HCL (CARDIAC) PF 100 MG/5ML IV SOSY
PREFILLED_SYRINGE | INTRAVENOUS | Status: DC | PRN
  Administered 2022-08-04: 60 mg via INTRATRACHEAL

## 2022-08-04 MED ORDER — SILVER NITRATE-POT NITRATE 75-25 % EX MISC
CUTANEOUS | Status: DC | PRN
  Administered 2022-08-04: 2

## 2022-08-04 MED ORDER — FENTANYL CITRATE (PF) 250 MCG/5ML IJ SOLN
INTRAMUSCULAR | Status: DC | PRN
  Administered 2022-08-04: 75 ug via INTRAVENOUS

## 2022-08-04 MED ORDER — DEXAMETHASONE SODIUM PHOSPHATE 10 MG/ML IJ SOLN
INTRAMUSCULAR | Status: DC | PRN
  Administered 2022-08-04: 10 mg via INTRAVENOUS

## 2022-08-04 MED ORDER — SUGAMMADEX SODIUM 200 MG/2ML IV SOLN
INTRAVENOUS | Status: DC | PRN
  Administered 2022-08-04: 200 mg via INTRAVENOUS

## 2022-08-04 MED ORDER — ROCURONIUM BROMIDE 100 MG/10ML IV SOLN
INTRAVENOUS | Status: DC | PRN
  Administered 2022-08-04: 80 mg via INTRAVENOUS
  Administered 2022-08-04: 20 mg via INTRAVENOUS

## 2022-08-04 MED ORDER — PROPOFOL 10 MG/ML IV BOLUS
INTRAVENOUS | Status: AC
  Filled 2022-08-04: qty 20

## 2022-08-04 MED ORDER — IBUPROFEN 800 MG PO TABS
800.0000 mg | ORAL_TABLET | Freq: Three times a day (TID) | ORAL | 0 refills | Status: AC | PRN
Start: 2022-08-04 — End: ?

## 2022-08-04 MED ORDER — PHENYLEPHRINE HCL (PRESSORS) 10 MG/ML IV SOLN
INTRAVENOUS | Status: DC | PRN
  Administered 2022-08-04: 160 ug via INTRAVENOUS

## 2022-08-04 MED ORDER — LIDOCAINE 2% (20 MG/ML) 5 ML SYRINGE
INTRAMUSCULAR | Status: AC
  Filled 2022-08-04: qty 5

## 2022-08-04 MED ORDER — CHLORHEXIDINE GLUCONATE 0.12 % MT SOLN
15.0000 mL | OROMUCOSAL | Status: AC
  Administered 2022-08-04: 15 mL via OROMUCOSAL
  Filled 2022-08-04: qty 15

## 2022-08-04 MED ORDER — KETOROLAC TROMETHAMINE 30 MG/ML IJ SOLN
INTRAMUSCULAR | Status: AC
  Filled 2022-08-04: qty 1

## 2022-08-04 MED ORDER — ACETAMINOPHEN 10 MG/ML IV SOLN
INTRAVENOUS | Status: AC
  Filled 2022-08-04: qty 100

## 2022-08-04 SURGICAL SUPPLY — 18 items
CATH ROBINSON RED A/P 16FR (CATHETERS) IMPLANT
CNTNR URN SCR LID CUP LEK RST (MISCELLANEOUS) ×3 IMPLANT
CONT SPEC 4OZ STRL OR WHT (MISCELLANEOUS)
DEVICE MYOSURE LITE (MISCELLANEOUS) IMPLANT
DEVICE MYOSURE REACH (MISCELLANEOUS) IMPLANT
DILATOR CANAL MILEX (MISCELLANEOUS) IMPLANT
GAUZE 4X4 16PLY ~~LOC~~+RFID DBL (SPONGE) IMPLANT
GLOVE BIO SURGEON STRL SZ 6.5 (GLOVE) ×3 IMPLANT
GLOVE BIOGEL PI IND STRL 7.0 (GLOVE) ×6 IMPLANT
GOWN STRL REUS W/ TWL LRG LVL3 (GOWN DISPOSABLE) ×6 IMPLANT
GOWN STRL REUS W/TWL LRG LVL3 (GOWN DISPOSABLE) ×4
KIT PROCEDURE FLUENT (KITS) ×3 IMPLANT
KIT TURNOVER KIT B (KITS) ×3 IMPLANT
PACK VAGINAL MINOR WOMEN LF (CUSTOM PROCEDURE TRAY) ×3 IMPLANT
PAD OB MATERNITY 4.3X12.25 (PERSONAL CARE ITEMS) ×3 IMPLANT
SEAL ROD LENS SCOPE MYOSURE (ABLATOR) ×3 IMPLANT
TOWEL GREEN STERILE FF (TOWEL DISPOSABLE) ×3 IMPLANT
UNDERPAD 30X36 HEAVY ABSORB (UNDERPADS AND DIAPERS) ×3 IMPLANT

## 2022-08-04 NOTE — Interval H&P Note (Signed)
History and Physical Interval Note:  08/04/2022 7:33 AM  Kristi Ortiz  has presented today for surgery, with the diagnosis of Thickened Endometrium.  The various methods of treatment have been discussed with the patient and family. After consideration of risks, benefits and other options for treatment, the patient has consented to  Procedure(s): DILATATION & CURETTAGE/HYSTEROSCOPY WITH MYOSURE (N/A) as a surgical intervention.  The patient's history has been reviewed, patient examined, no change in status, stable for surgery.  I have reviewed the patient's chart and labs.  Questions were answered to the patient's satisfaction.     [name]

## 2022-08-04 NOTE — Transfer of Care (Signed)
Immediate Anesthesia Transfer of Care Note  Patient: Jeaneth Reckling  Procedure(s) Performed: DILATATION & CURETTAGE (Vagina ) HYSTEROSCOPY (Vagina )  Patient Location: PACU  Anesthesia Type:General  Level of Consciousness: awake, alert , and oriented  Airway & Oxygen Therapy: Patient Spontanous Breathing and Patient connected to face mask oxygen  Post-op Assessment: Report given to RN and Post -op Vital signs reviewed and stable  Post vital signs: Reviewed and stable  Last Vitals:  Vitals Value Taken Time  BP 198/178 08/04/22 0903  Temp    Pulse 85 08/04/22 0905  Resp 20 08/04/22 0905  SpO2 94 % 08/04/22 0905  Vitals shown include unvalidated device data.  Last Pain:  Vitals:   08/04/22 0619  TempSrc:   PainSc: 0-No pain      Patients Stated Pain Goal: 0 (08/04/22 1610)  Complications: No notable events documented.

## 2022-08-04 NOTE — Anesthesia Procedure Notes (Addendum)
Procedure Name: Intubation Date/Time: 08/04/2022 8:01 AM  Performed by: Alwyn Ren, CRNAPre-anesthesia Checklist: Patient identified, Emergency Drugs available, Suction available and Patient being monitored Patient Re-evaluated:Patient Re-evaluated prior to induction Oxygen Delivery Method: Circle system utilized Preoxygenation: Pre-oxygenation with 100% oxygen Induction Type: IV induction Ventilation: Mask ventilation without difficulty and Two handed mask ventilation required Laryngoscope Size: Miller, 2, Mac, 3 and Glidescope Tube type: Oral Tube size: 7.0 mm Number of attempts: 3 Airway Equipment and Method: Stylet and Oral airway Placement Confirmation: ETT inserted through vocal cords under direct vision, positive ETCO2 and breath sounds checked- equal and bilateral Secured at: 22 cm Tube secured with: Tape Dental Injury: Teeth and Oropharynx as per pre-operative assessment  Comments: Two handed mask required. Poor dentition. Attempted intubation x 2 with Mac and Miller blades by Ludwig Clarks,. Glidescope x 1-teeth as before

## 2022-08-04 NOTE — Anesthesia Preprocedure Evaluation (Signed)
Anesthesia Evaluation  Patient identified by MRN, date of birth, ID band Patient awake    Reviewed: Allergy & Precautions, NPO status , Patient's Chart, lab work & pertinent test results  History of Anesthesia Complications Negative for: history of anesthetic complications  Airway Mallampati: III  TM Distance: >3 FB Neck ROM: Full    Dental  (+) Poor Dentition, Dental Advisory Given,    Pulmonary asthma , sleep apnea    breath sounds clear to auscultation       Cardiovascular negative cardio ROS  Rhythm:Regular  Left ventricle: The cavity size was normal. Wall thickness    was normal. Systolic function was normal. The estimated    ejection fraction was in the range of 55% to 60%. Regional    wall motion abnormalities cannot be excluded. Doppler    parameters are consistent with abnormal left ventricular    relaxation (grade 1 diastolic dysfunction).  - Left atrium: The atrium was mildly dilated.     Neuro/Psych  PSYCHIATRIC DISORDERS Anxiety Depression    negative neurological ROS     GI/Hepatic ,GERD  Controlled,,  Endo/Other  Hypothyroidism  Morbid obesityLab Results      Component                Value               Date                      HGBA1C                   5.8                 01/21/2016             Renal/GU Lab Results      Component                Value               Date                      CREATININE               0.68                01/31/2022            Lab Results      Component                Value               Date                      NA                       138                 01/31/2022                K                        3.8                 01/31/2022                CO2  23                  01/31/2022                GLUCOSE                  105 (H)             01/31/2022                BUN                      9                   01/31/2022                CREATININE                0.68                01/31/2022                CALCIUM                  8.4 (L)             01/31/2022                GFRNONAA                 >60                 01/31/2022                Musculoskeletal  (+) Arthritis ,    Abdominal   Peds  Hematology negative hematology ROS (+) Lab Results      Component                Value               Date                      WBC                      5.3                 01/31/2022                HGB                      13.0                01/31/2022                HCT                      40.4                01/31/2022                MCV                      89.0                01/31/2022                PLT  240                 01/31/2022              Anesthesia Other Findings   Reproductive/Obstetrics                              Anesthesia Physical Anesthesia Plan  ASA: 3  Anesthesia Plan: General   Post-op Pain Management: Toradol IV (intra-op)* and Ofirmev IV (intra-op)*   Induction: Intravenous  PONV Risk Score and Plan: 3 and Ondansetron and Dexamethasone  Airway Management Planned: Oral ETT and LMA  Additional Equipment: None  Intra-op Plan:   Post-operative Plan: Extubation in OR  Informed Consent: I have reviewed the patients History and Physical, chart, labs and discussed the procedure including the risks, benefits and alternatives for the proposed anesthesia with the patient or authorized representative who has indicated his/her understanding and acceptance.     Dental advisory given  Plan Discussed with: CRNA  Anesthesia Plan Comments:          Anesthesia Quick Evaluation

## 2022-08-04 NOTE — Op Note (Signed)
Pre Op Dx:   1. Thickened endometrium on ultrasound (1.34cm) 2. Secondary amenorrhea 3. Uterine fibroids  Post Op Dx:   Same as pre-operative diagnoses  Procedure:   Hysteroscopy with Dilation and Curettage with Myosure   Surgeon:  Dr. [name] Assistants:  None Anesthesia:  General   EBL:  20cc  IVF:  1000cc UOP:  400cc via in and out catheter Fluid Deficit:  450cc   Drains:  None Specimen removed:  Endometrial curettings and portion of submucosal fibroid - sent to pathology Device(s) implanted: None Case Type:  Clean-contaminated Findings:  Normal-appearing nulliparous cervix, normal-appearing endocervical canal. Endometrial cavity distorted by submucosal fibroid which was visible on the right cornual/fundal region. Bilateral tubal ostia not visualized. Mildly proliferative-appearing endometrium. Complications: None Indications:  52 y.o. G0 with thickened endometrium on ultrasound and secondary amenorrhea (no menses since age 67).  Description of each procedure:  After informed consent was obtained the patient was taken to the operating room in the dorsal supine position.  After administration of general anesthesia, the patient was placed in the dorsal lithotomy position and prepped and draped in the usual sterile fashion.  Her bladder was emptied using an in and out catheter.  A pre-operative time-out was completed.  The anterior lip of the cervix was grasped with a single-tooth tenaculum and the cervix was serially dilated to accommodate the hysteroscope.  The hysteroscope was advanced and the findings as above was noted. The Myosure Reach was used to resect the endometrium in its entirety and a portion of the submucosal fibroid. A smooth banjo curette was used to curettage the endometrium. The single-tooth tenaculum was removed and its sites were made hemostatic with silver nitrate and pressure.  Adequate hemostasis was noted.  The patient was awakened and extubated and  appeared to have tolerated the procedure well.  All counts were correct.  Disposition:  PACU  [name], DO

## 2022-08-05 ENCOUNTER — Encounter (HOSPITAL_COMMUNITY): Payer: Self-pay | Admitting: Obstetrics and Gynecology

## 2022-08-05 LAB — SURGICAL PATHOLOGY

## 2022-08-06 NOTE — Anesthesia Postprocedure Evaluation (Signed)
Anesthesia Post Note  Patient: Syra Sickinger  Procedure(s) Performed: DILATATION & CURETTAGE (Vagina ) HYSTEROSCOPY (Vagina )     Patient location during evaluation: PACU Anesthesia Type: General Level of consciousness: patient cooperative and awake Pain management: pain level controlled Vital Signs Assessment: post-procedure vital signs reviewed and stable Respiratory status: spontaneous breathing, nonlabored ventilation and respiratory function stable Cardiovascular status: blood pressure returned to baseline and stable Postop Assessment: no apparent nausea or vomiting Anesthetic complications: no   No notable events documented.  Last Vitals:  Vitals:   08/04/22 0930 08/04/22 0933  BP: 118/80 118/80  Pulse: 76 75  Resp: 19 17  Temp:  (!) 36.3 C  SpO2: 96% 94%    Last Pain:  Vitals:   08/04/22 0933  TempSrc:   PainSc: 0-No pain                 Karanveer Ramakrishnan

## 2023-02-25 ENCOUNTER — Ambulatory Visit
Admission: EM | Admit: 2023-02-25 | Discharge: 2023-02-25 | Disposition: A | Discharge status: Home / Self Care | Payer: Medicaid Other

## 2023-02-25 ENCOUNTER — Ambulatory Visit: Payer: Medicaid Other

## 2023-02-25 DIAGNOSIS — J189 Pneumonia, unspecified organism: Secondary | ICD-10-CM

## 2023-02-25 MED ORDER — AMOXICILLIN-POT CLAVULANATE 875-125 MG PO TABS: 1.0000 | ORAL_TABLET | Freq: Two times a day (BID) | ORAL | 0 refills | Status: DC

## 2023-02-25 MED ORDER — AZITHROMYCIN 250 MG PO TABS: 250.0000 mg | ORAL_TABLET | Freq: Every day | ORAL | 0 refills | Status: DC

## 2023-02-25 NOTE — ED Triage Notes (Signed)
"  First I had problems with Cough and breathing for the last month or so". "12 days ago cough continued with congestion and productive cough". "With cough, I urinate on myself with most recently starting to have loose stools". No dysuria "just frequency". "Headaches on/off I have noticed". No fever known.

## 2023-03-02 ENCOUNTER — Encounter: Payer: Self-pay | Admitting: Physician Assistant

## 2023-03-02 NOTE — ED Provider Notes (Signed)
[facility]    CSN: [id number] Arrival date & time: 02/25/23  1114      History   Chief Complaint Chief Complaint  Patient presents with   Asthma Exacerbation   Diarrhea   UTI Symptoms    HPI Kristi Ortiz is a 52 y.o. female.   Patient here today for evaluation of continued cough for the last month.  He reports she reports that 12 days ago cough seemed to worsen.  She notes she coughed so much that she would urinate on herself accidentally.  She is also started having some loose stools.  She has had some headaches off and on.  She denies fever.  She has taken over-the-counter medication without resolution.  The history is provided by the patient.  Diarrhea Associated symptoms: no abdominal pain, no chills, no fever and no vomiting     Past Medical History:  Diagnosis Date   ADHD (attention deficit hyperactivity disorder)    ADD - took medications in elementary school   Allergy    Anxiety    Arthritis    Asthma Dx1990   Depression    GERD (gastroesophageal reflux disease)    occasional   Hypothyroid Dx 1994   Sleep apnea DX 2012   not using cpap, keeps head elevated   Sleep apnea    Sleep apnea    Sleep apnea    Vitamin D insufficiency 10/26/2013    Patient Active Problem List   Diagnosis Date Noted   Osteoarthritis of right knee 02/23/2019   Degeneration of lumbar intervertebral disc 02/23/2019   Lumbar spondylosis 02/23/2019   Lumbar pain 01/31/2019   Low back pain 01/24/2019   Pain in right knee 01/23/2019   Obstructive sleep apnea 01/16/2016   Cognitive impairment 05/16/2015   GAD (generalized anxiety disorder) 05/16/2015   MDD (major depressive disorder) 05/16/2015   Severe obesity (BMI >= 40) (HCC) 02/07/2014   IFG (impaired fasting glucose) 10/26/2013   Allergic rhinitis 12/15/2012   Cerumen impaction 12/15/2012   Hypothyroidism 09/01/2012   OSA (obstructive sleep apnea) 09/01/2012   Memory difficulty 09/01/2012   Asthma  09/01/2012   Edema 09/01/2012    Past Surgical History:  Procedure Laterality Date   DENTAL SURGERY  2015   Took removal   DILATATION & CURETTAGE/HYSTEROSCOPY WITH MYOSURE N/A 08/04/2022   Procedure: DILATATION & CURETTAGE;  Surgeon: [name], DO;  Location: [facility] OR;  Service: Gynecology;  Laterality: N/A;   HYSTEROSCOPY  08/04/2022   Procedure: HYSTEROSCOPY;  Surgeon: [name], DO;  Location: [facility] OR;  Service: Gynecology;;    OB History   No obstetric history on file.      Home Medications    Prior to Admission medications   Medication Sig Start Date End Date Taking? Authorizing Provider  albuterol (VENTOLIN HFA) 108 (90 Base) MCG/ACT inhaler Inhale 1-2 puffs into the lungs every 6 (six) hours as needed for wheezing or shortness of breath. Patient taking differently: Inhale 1-2 puffs into the lungs every 6 (six) hours as needed for wheezing or shortness of breath (Cough). 01/31/22  Yes Edwin Dada P, DO  amoxicillin-clavulanate (AUGMENTIN) 875-125 MG tablet Take 1 tablet by mouth every 12 (twelve) hours. 02/25/23  Yes Tomi Bamberger, PA-C  azithromycin (ZITHROMAX) 250 MG tablet Take 1 tablet (250 mg total) by mouth daily. Take first 2 tablets together, then 1 every day until finished. 02/25/23  Yes Tomi Bamberger, PA-C  cetirizine (ZYRTEC) 10 MG chewable tablet Chew 1 tablet (10 mg  total) by mouth daily. 12/06/15  Yes English, Judeth Cornfield D, PA  dextromethorphan-guaiFENesin (MUCINEX DM) 30-600 MG 12hr tablet Take 1 tablet by mouth 2 (two) times daily.   Yes [provider]  DM-Phenylephrine-Acetaminophen (ALKA-SELTZER PLS SINUS & COUGH PO) Take by mouth.   Yes [provider]  Menthol 5.8 MG LOZG Use as directed in the mouth or throat.   Yes [provider]  montelukast (SINGULAIR) 10 MG tablet Take 10 mg by mouth in the morning.   Yes [provider]  acetaminophen (TYLENOL) 325 MG tablet Take 325 mg by mouth every 6 (six) hours as  needed for mild pain (pain score 1-3), moderate pain (pain score 4-6), fever or headache. 01/16/16   [provider]  albuterol (VENTOLIN HFA) 108 (90 Base) MCG/ACT inhaler Inhale 2 puffs into the lungs every 4 (four) hours as needed for wheezing or shortness of breath.    [provider]  Bismuth Subsalicylate (KAOPECTATE) 262 MG TABS Take 1-2 tablets by mouth daily as needed (Diarrhea).    [provider]  EUTHYROX 175 MCG tablet Take 175 mcg by mouth daily. 02/19/23   [provider]  hydrocortisone 2.5 % cream Apply 1 Application topically daily as needed (itching).    [provider]  ibuprofen (ADVIL) 200 MG tablet Take 200 mg by mouth every 6 (six) hours as needed.    [provider]  ibuprofen (ADVIL) 800 MG tablet Take 1 tablet (800 mg total) by mouth every 8 (eight) hours as needed for cramping, moderate pain or mild pain. 08/04/22   [name], DO  Lactobacillus (ACIDOPHILUS PO) Take 1 tablet by mouth at bedtime.    [provider]  lactobacillus acidophilus (BACID) TABS tablet Take 1 tablet by mouth at bedtime.    [provider]  levothyroxine (SYNTHROID) 137 MCG tablet Take 137 mcg by mouth daily before breakfast. 07/01/16   [provider]  Loperamide-Simethicone 2-125 MG TABS Take 1 tablet by mouth as needed.    [provider]  montelukast (SINGULAIR) 10 MG tablet TAKE 1 TABLET BY MOUTH ONCE DAILY FOR 30 DAYS    [provider]  Multiple Vitamin (MULTIVITAMIN) tablet Take 1 tablet by mouth daily. Centrum woman    [provider]  nystatin-triamcinolone (MYCOLOG II) cream Apply 1 Application topically 2 (two) times daily as needed (Rash). 03/31/22   [provider]    Family History Family History  Problem Relation Age of Onset   Diabetes Mother    Hypertension Mother    Psoriasis Father    Healthy Brother    Healthy Sister    Hyperlipidemia Sister     Hypertension Sister    Mental illness Maternal Grandmother    Cancer Maternal Grandfather    Mental illness Maternal Grandfather    Stroke Maternal Grandfather    Cancer Paternal Grandfather     Social History Social History   Tobacco Use   Smoking status: Never    Passive exposure: Past   Smokeless tobacco: Never  Vaping Use   Vaping status: Never Used  Substance Use Topics   Alcohol use: Not Currently    Comment: rare   Drug use: No     Allergies   Molds & smuts and Mold extract [trichophyton]   Review of Systems Review of Systems  Constitutional:  Negative for chills and fever.  HENT:  Positive for congestion. Negative for ear pain and sore throat.   Eyes:  Negative for discharge and  redness.  Respiratory:  Positive for cough. Negative for shortness of breath and wheezing.   Gastrointestinal:  Positive for diarrhea. Negative for abdominal pain, nausea and vomiting.     Physical Exam Triage Vital Signs ED Triage Vitals  Encounter Vitals Group     BP 02/25/23 1146 124/73     Systolic BP Percentile --      Diastolic BP Percentile --      Pulse Rate 02/25/23 1146 90     Resp 02/25/23 1146 20     Temp 02/25/23 1146 98.6 F (37 C)     Temp Source 02/25/23 1146 Oral     SpO2 02/25/23 1146 96 %     Weight 02/25/23 1142 253 lb (114.8 kg)     Height 02/25/23 1142 5\' 5"  (1.651 m)     Head Circumference --      Peak Flow --      Pain Score 02/25/23 1139 0     Pain Loc --      Pain Education --      Exclude from Growth Chart --    No data found.  Updated Vital Signs BP 124/73 (BP Location: Left Arm)   Pulse 90   Temp 98.6 F (37 C) (Oral)   Resp 20   Ht 5\' 5"  (1.651 m)   Wt 253 lb (114.8 kg)   LMP 12/25/2015 (Approximate)   SpO2 96%   BMI 42.10 kg/m   Visual Acuity Right Eye Distance:   Left Eye Distance:   Bilateral Distance:    Right Eye Near:   Left Eye Near:    Bilateral Near:     Physical Exam Vitals and nursing note reviewed.   Constitutional:      General: She is not in acute distress.    Appearance: Normal appearance. She is not ill-appearing.  HENT:     Head: Normocephalic and atraumatic.     Nose: Congestion present.     Mouth/Throat:     Mouth: Mucous membranes are moist.     Pharynx: No oropharyngeal exudate or posterior oropharyngeal erythema.  Eyes:     Conjunctiva/sclera: Conjunctivae normal.  Cardiovascular:     Rate and Rhythm: Normal rate and regular rhythm.     Heart sounds: Normal heart sounds. No murmur heard. Pulmonary:     Effort: Pulmonary effort is normal. No respiratory distress.     Breath sounds: Normal breath sounds. No wheezing, rhonchi or rales.  Skin:    General: Skin is warm and dry.  Neurological:     Mental Status: She is alert.  Psychiatric:        Mood and Affect: Mood normal.        Thought Content: Thought content normal.      UC Treatments / Results  Labs (all labs ordered are listed, but only abnormal results are displayed) Labs Reviewed - No data to display  EKG   Radiology No results found.  Procedures Procedures (including critical care time)  Medications Ordered in UC Medications - No data to display  Initial Impression / Assessment and Plan / UC Course  I have reviewed the triage vital signs and the nursing notes.  Pertinent labs & imaging results that were available during my care of the patient were reviewed by me and considered in my medical decision making (see chart for details).    Will treat to cover possible pneumonia with Augmentin and Z-Pak.  Chest x-ray ultimately read as negative however some suspicion for  infiltrate in left lower lobe.  Advise follow-up if no gradual improvement with any further concerns.  Patient expressed understanding.  Final Clinical Impressions(s) / UC Diagnoses   Final diagnoses:  Pneumonia of left lower lobe due to infectious organism   Discharge Instructions   None    ED Prescriptions      Medication Sig Dispense Auth. Provider   amoxicillin-clavulanate (AUGMENTIN) 875-125 MG tablet Take 1 tablet by mouth every 12 (twelve) hours. 14 tablet Erma Pinto F, PA-C   azithromycin (ZITHROMAX) 250 MG tablet Take 1 tablet (250 mg total) by mouth daily. Take first 2 tablets together, then 1 every day until finished. 6 tablet Tomi Bamberger, PA-C      PDMP not reviewed this encounter.   Tomi Bamberger, PA-C 03/02/23 1944

## 2023-03-07 ENCOUNTER — Other Ambulatory Visit: Payer: Self-pay

## 2023-03-07 ENCOUNTER — Emergency Department (HOSPITAL_COMMUNITY)
Admission: EM | Admit: 2023-03-07 | Discharge: 2023-03-07 | Disposition: A | Discharge status: Home / Self Care | Payer: Medicaid Other | Attending: Emergency Medicine | Admitting: Emergency Medicine

## 2023-03-07 ENCOUNTER — Ambulatory Visit
Admission: EM | Admit: 2023-03-07 | Discharge: 2023-03-07 | Disposition: A | Discharge status: Home / Self Care | Payer: Medicaid Other

## 2023-03-07 ENCOUNTER — Encounter (HOSPITAL_COMMUNITY): Payer: Self-pay | Admitting: Emergency Medicine

## 2023-03-07 ENCOUNTER — Emergency Department (HOSPITAL_COMMUNITY): Payer: Medicaid Other

## 2023-03-07 DIAGNOSIS — J069 Acute upper respiratory infection, unspecified: Secondary | ICD-10-CM | POA: Diagnosis not present

## 2023-03-07 DIAGNOSIS — R059 Cough, unspecified: Secondary | ICD-10-CM | POA: Diagnosis present

## 2023-03-07 DIAGNOSIS — J189 Pneumonia, unspecified organism: Secondary | ICD-10-CM

## 2023-03-07 LAB — CBC WITH DIFFERENTIAL/PLATELET
Abs Immature Granulocytes: 0.02 10*3/uL (ref 0.00–0.07)
Basophils Absolute: 0 10*3/uL (ref 0.0–0.1)
Basophils Relative: 1 %
Eosinophils Absolute: 0.4 10*3/uL (ref 0.0–0.5)
Eosinophils Relative: 7 %
HCT: 38.9 % (ref 36.0–46.0)
Hemoglobin: 12.1 g/dL (ref 12.0–15.0)
Immature Granulocytes: 0 %
Lymphocytes Relative: 27 %
Lymphs Abs: 1.7 10*3/uL (ref 0.7–4.0)
MCH: 27 pg (ref 26.0–34.0)
MCHC: 31.1 g/dL (ref 30.0–36.0)
MCV: 86.8 fL (ref 80.0–100.0)
Monocytes Absolute: 0.8 10*3/uL (ref 0.1–1.0)
Monocytes Relative: 13 %
Neutro Abs: 3.2 10*3/uL (ref 1.7–7.7)
Neutrophils Relative %: 52 %
Platelets: 323 10*3/uL (ref 150–400)
RBC: 4.48 MIL/uL (ref 3.87–5.11)
RDW: 15.1 % (ref 11.5–15.5)
WBC: 6.2 10*3/uL (ref 4.0–10.5)
nRBC: 0 % (ref 0.0–0.2)

## 2023-03-07 LAB — BASIC METABOLIC PANEL
Anion gap: 10 (ref 5–15)
BUN: 6 mg/dL (ref 6–20)
CO2: 25 mmol/L (ref 22–32)
Calcium: 8.9 mg/dL (ref 8.9–10.3)
Chloride: 105 mmol/L (ref 98–111)
Creatinine, Ser: 0.7 mg/dL (ref 0.44–1.00)
GFR, Estimated: >60 mL/min (ref >60)
Glucose, Bld: 107 mg/dL — ABNORMAL HIGH (ref 70–99)
Potassium: 3.7 mmol/L (ref 3.5–5.1)
Sodium: 140 mmol/L (ref 135–145)

## 2023-03-07 LAB — D-DIMER, QUANTITATIVE: D-Dimer, Quant: 0.46 ug{FEU}/mL (ref 0.00–0.50)

## 2023-03-07 MED ORDER — BENZONATATE 100 MG PO CAPS
100.0000 mg | ORAL_CAPSULE | Freq: Once | ORAL | Status: AC
  Administered 2023-03-07: 100 mg via ORAL
  Filled 2023-03-07: qty 1

## 2023-03-07 MED ORDER — PREDNISONE 20 MG PO TABS: 40.0000 mg | ORAL_TABLET | Freq: Every day | ORAL | 0 refills | Status: DC

## 2023-03-07 MED ORDER — PREDNISONE 20 MG PO TABS
60.0000 mg | ORAL_TABLET | Freq: Once | ORAL | Status: AC
  Administered 2023-03-07: 60 mg via ORAL
  Filled 2023-03-07: qty 3

## 2023-03-07 MED ORDER — IPRATROPIUM BROMIDE 0.02 % IN SOLN
0.5000 mg | Freq: Once | RESPIRATORY_TRACT | Status: AC
  Administered 2023-03-07: 0.5 mg via RESPIRATORY_TRACT
  Filled 2023-03-07: qty 2.5

## 2023-03-07 MED ORDER — BENZONATATE 100 MG PO CAPS: 100.0000 mg | ORAL_CAPSULE | Freq: Three times a day (TID) | ORAL | 0 refills | Status: DC

## 2023-03-07 MED ORDER — ALBUTEROL SULFATE (2.5 MG/3ML) 0.083% IN NEBU
5.0000 mg | INHALATION_SOLUTION | Freq: Once | RESPIRATORY_TRACT | Status: AC
  Administered 2023-03-07: 5 mg via RESPIRATORY_TRACT
  Filled 2023-03-07: qty 6

## 2023-03-07 NOTE — Discharge Instructions (Addendum)
Please read and follow all provided instructions.  Your diagnoses today include:  1. Viral URI with cough     Tests performed today include: Chest x-ray - does not show any pneumonia Blood cell counts and electrolytes: Were normal no problems Screening test for blood clot: Was normal Vital signs. See below for your results today.   Medications prescribed:  Prednisone - steroid medicine   It is best to take this medication in the morning to prevent sleeping problems. If you are diabetic, monitor your blood sugar closely and stop taking Prednisone if blood sugar is over 300. Take with food to prevent stomach upset.   Tessalon Perles - cough suppressant medication  Take any prescribed medications only as directed.  Home care instructions:  Follow any educational materials contained in this packet.  Follow-up instructions: Please follow-up with your primary care provider in the next 5-7 days for further evaluation of your symptoms and a recheck if you are not feeling better.   Return instructions:  Please return to the Emergency Department if you experience worsening symptoms. Please return with worsening wheezing, shortness of breath, or difficulty breathing. Return with persistent fever above 101F.  Please return if you have any other emergent concerns.  Additional Information:  Your vital signs today were: BP (!) 146/105 (BP Location: Right Arm)   Pulse 97   Temp 97.6 F (36.4 C)   Resp 18   Ht 5\' 5"  (1.651 m)   Wt 119 kg   LMP 12/25/2015 (Approximate)   SpO2 99%   BMI 43.66 kg/m  If your blood pressure (BP) was elevated above 135/85 this visit, please have this repeated by your doctor within one month. --------------

## 2023-03-07 NOTE — ED Provider Triage Note (Signed)
Emergency Medicine Provider Triage Evaluation Note  Kristi Ortiz , a 52 y.o. female  was evaluated in triage.  Pt complains of sob. Report being diagnosed with pneumonia 10 days ago after having cough for 1 month.  Finished with abx but still having sob, non productive cough, and stress incontinence.  Was seen at [facility] today but sent here for further care.  No hx of PE.  Endorse hx of asthma  Review of Systems  Positive: As above Negative: As above  Physical Exam  BP (!) 146/105 (BP Location: Right Arm)   Pulse 97   Temp 97.6 F (36.4 C)   Resp 18   Ht 5\' 5"  (1.651 m)   Wt 119 kg   LMP 12/25/2015 (Approximate)   SpO2 99%   BMI 43.66 kg/m  Gen:   Awake, no distress   Resp:  Normal effort  MSK:   Moves extremities without difficulty  Other:    Medical Decision Making  Medically screening exam initiated at 4:09 PM.  Appropriate orders placed.  [name] was informed that the remainder of the evaluation will be completed by another provider, this initial triage assessment does not replace that evaluation, and the importance of remaining in the ED until their evaluation is complete.     Fayrene Helper, PA-C 03/07/23 1610

## 2023-03-07 NOTE — ED Provider Notes (Signed)
Kristi Ortiz Provider Note   CSN: [id number] Arrival date & time: 03/07/23  1548     History  Chief Complaint  Patient presents with   Nasal Congestion   Cough    Kristi Ortiz is a 52 y.o. female.  Patient presents to the emergency department for evaluation of ongoing cough and shortness of breath.  She was treated with Augmentin and azithromycin starting 10 days ago for "left lower lobe pneumonia".  She returned to urgent care today with persistent symptoms.  Also reports stress incontinence with cough.  She states that she has Advair that she takes at home and albuterol.  She has not been on steroids for her current issue.  She states that her oxygen level is low at urgent care so they sent her to the ED. Patient denies risk factors for pulmonary embolism including: unilateral leg swelling, history of DVT/PE/other blood clots, use of exogenous hormones, recent immobilizations, recent surgery, recent travel (>4hr segment), malignancy, hemoptysis.          Home Medications Prior to Admission medications   Medication Sig Start Date End Date Taking? Authorizing Provider  acetaminophen (TYLENOL) 325 MG tablet Take 325 mg by mouth every 6 (six) hours as needed for mild pain (pain score 1-3), moderate pain (pain score 4-6), fever or headache. 01/16/16   [provider]  albuterol (VENTOLIN HFA) 108 (90 Base) MCG/ACT inhaler Inhale 1-2 puffs into the lungs every 6 (six) hours as needed for wheezing or shortness of breath. Patient taking differently: Inhale 1-2 puffs into the lungs every 6 (six) hours as needed for wheezing or shortness of breath (Cough). 01/31/22   Edwin Dada P, DO  albuterol (VENTOLIN HFA) 108 (90 Base) MCG/ACT inhaler Inhale 2 puffs into the lungs every 4 (four) hours as needed for wheezing or shortness of breath.    [provider]  amoxicillin-clavulanate (AUGMENTIN) 875-125 MG tablet Take 1 tablet by  mouth every 12 (twelve) hours. Patient taking differently: Take 1 tablet by mouth every 12 (twelve) hours. Last dose: Tuesday. 02/25/23   Tomi Bamberger, PA-C  azithromycin (ZITHROMAX) 250 MG tablet Take 1 tablet (250 mg total) by mouth daily. Take first 2 tablets together, then 1 every day until finished. Patient taking differently: Take 250 mg by mouth daily. Take first 2 tablets together, then 1 every day until finished. Last dose: Thursday. 02/25/23   Tomi Bamberger, PA-C  Bismuth Subsalicylate (KAOPECTATE) 262 MG TABS Take 1-2 tablets by mouth daily as needed (Diarrhea).    [provider]  cetirizine (ZYRTEC) 10 MG chewable tablet Chew 1 tablet (10 mg total) by mouth daily. 12/06/15   Trena Platt D, PA  dextromethorphan-guaiFENesin (MUCINEX DM) 30-600 MG 12hr tablet Take 1 tablet by mouth 2 (two) times daily.    [provider]  DM-Phenylephrine-Acetaminophen (ALKA-SELTZER PLS SINUS & COUGH PO) Take by mouth.    [provider]  EUTHYROX 175 MCG tablet Take 175 mcg by mouth daily. 02/19/23   [provider]  hydrocortisone 2.5 % cream Apply 1 Application topically daily as needed (itching).    [provider]  ibuprofen (ADVIL) 200 MG tablet Take 200 mg by mouth every 6 (six) hours as needed.    [provider]  ibuprofen (ADVIL) 800 MG tablet Take 1 tablet (800 mg total) by mouth every 8 (eight) hours as needed for cramping, moderate pain or mild pain. 08/04/22   [name], DO  Lactobacillus (ACIDOPHILUS  PO) Take 1 tablet by mouth at bedtime.    [provider]  lactobacillus acidophilus (BACID) TABS tablet Take 1 tablet by mouth at bedtime.    [provider]  levothyroxine (SYNTHROID) 137 MCG tablet Take 137 mcg by mouth daily before breakfast. 07/01/16   [provider]  Loperamide-Simethicone 2-125 MG TABS Take 1 tablet by mouth as needed.    [provider]  Menthol 5.8 MG LOZG Use as  directed in the mouth or throat.    [provider]  montelukast (SINGULAIR) 10 MG tablet TAKE 1 TABLET BY MOUTH ONCE DAILY FOR 30 DAYS    [provider]  montelukast (SINGULAIR) 10 MG tablet Take 10 mg by mouth in the morning.    [provider]  Multiple Vitamin (MULTIVITAMIN) tablet Take 1 tablet by mouth daily. Centrum woman. Immune Suppliment.    [provider]  nystatin-triamcinolone (MYCOLOG II) cream Apply 1 Application topically 2 (two) times daily as needed (Rash). 03/31/22   [provider]      Allergies    Molds & smuts and Mold extract [trichophyton]    Review of Systems   Review of Systems  Physical Exam Updated Vital Signs BP (!) 146/105 (BP Location: Right Arm)   Pulse 97   Temp 97.6 F (36.4 C)   Resp 18   Ht 5\' 5"  (1.651 m)   Wt 119 kg   LMP 12/25/2015 (Approximate)   SpO2 99%   BMI 43.66 kg/m  Physical Exam Vitals and nursing note reviewed.  Constitutional:      General: She is not in acute distress.    Appearance: She is well-developed.  HENT:     Head: Normocephalic and atraumatic.     Right Ear: External ear normal.     Left Ear: External ear normal.     Nose: Nose normal.     Mouth/Throat:     Mouth: Mucous membranes are moist.  Eyes:     Conjunctiva/sclera: Conjunctivae normal.  Cardiovascular:     Rate and Rhythm: Normal rate and regular rhythm.     Heart sounds: No murmur heard. Pulmonary:     Effort: No respiratory distress.     Breath sounds: No wheezing, rhonchi or rales.     Comments: Frequent coughing during exam Abdominal:     Palpations: Abdomen is soft.     Tenderness: There is no abdominal tenderness. There is no guarding or rebound.  Musculoskeletal:     Cervical back: Normal range of motion and neck supple.     Right lower leg: No edema.     Left lower leg: No edema.  Skin:    General: Skin is warm and dry.     Findings: No rash.  Neurological:     General: No focal deficit  present.     Mental Status: She is alert. Mental status is at baseline.     Motor: No weakness.  Psychiatric:        Mood and Affect: Mood normal.     ED Results / Procedures / Treatments   Labs (all labs ordered are listed, but only abnormal results are displayed) Labs Reviewed  BASIC METABOLIC PANEL - Abnormal; Notable for the following components:      Result Value   Glucose, Bld 107 (*)    All other components within normal limits  CBC WITH DIFFERENTIAL/PLATELET  D-DIMER, QUANTITATIVE    EKG None  Radiology DG Chest 2 View Result Date: 03/07/2023 CLINICAL  DATA:  Shortness of breath, cough, and congestion. EXAM: CHEST - 2 VIEW COMPARISON:  Chest radiograph dated 02/25/2023. FINDINGS: No focal consolidation, pleural effusion, pneumothorax. The cardiac silhouette is within normal limits. No acute osseous pathology. IMPRESSION: No active cardiopulmonary disease. Electronically Signed   By: Elgie Collard M.D.   On: 03/07/2023 17:20    Procedures Procedures    Medications Ordered in ED Medications  benzonatate (TESSALON) capsule 100 mg (100 mg Oral Given 03/07/23 1629)  albuterol (PROVENTIL) (2.5 MG/3ML) 0.083% nebulizer solution 5 mg (5 mg Nebulization Given 03/07/23 1725)  ipratropium (ATROVENT) nebulizer solution 0.5 mg (0.5 mg Nebulization Given 03/07/23 1725)  predniSONE (DELTASONE) tablet 60 mg (60 mg Oral Given 03/07/23 1725)    ED Course/ Medical Decision Making/ A&P    Patient seen and examined. History obtained directly from patient. Work-up including labs, imaging, EKG ordered in triage, if performed, were reviewed.    Labs/EKG: Independently reviewed and interpreted.  This included: CBC normal and unremarkable; BMP glucose 107 otherwise unremarkable; D-dimer normal.  Imaging: Independently visualized and interpreted.  This included: Chest x-ray, agree no pneumonia  Medications/Fluids: Ordered: Albuterol/Atrovent, p.o. prednisone  Most recent vital  signs reviewed and are as follows: BP (!) 146/105 (BP Location: Right Arm)   Pulse 97   Temp 97.6 F (36.4 C)   Resp 18   Ht 5\' 5"  (1.651 m)   Wt 119 kg   LMP 12/25/2015 (Approximate)   SpO2 99%   BMI 43.66 kg/m   Initial impression: Plan to ambulate with pulse ox.  8:42 PM Reassessment performed. Patient appears stable.  Seems to be coughing less in general.  She ambulated without desaturation.  Reviewed pertinent lab work and imaging with patient at bedside. Questions answered.   Most current vital signs reviewed and are as follows: BP (!) 146/105 (BP Location: Right Arm)   Pulse 97   Temp 97.6 F (36.4 C)   Resp 18   Ht 5\' 5"  (1.651 m)   Wt 119 kg   LMP 12/25/2015 (Approximate)   SpO2 99%   BMI 43.66 kg/m   Plan: Discharge to home.   Prescriptions written for: Prednisone, Tessalon  Other home care instructions discussed: Avoidance of triggers, hydrate well.  Discussed taking prednisone earlier in the day with food.  ED return instructions discussed: Return with worsening shortness of breath, increased work of breathing, fever, persistent vomiting  Follow-up instructions discussed: Patient encouraged to follow-up with their PCP in 5-7 days.                                 Medical Decision Making Risk Prescription drug management.   Patient with ongoing cough after recent viral illness.  Vital signs are reassuring.  Chest x-ray remains clear.  D-dimer negative.  Lab workup is otherwise reassuring.  Patient has ambulated without desaturation.  Low concern for occult pneumonia.  Patient previously treated with Augmentin and azithromycin.  Will continue treatment for cough.  She may benefit from short course of steroids.  The patient's vital signs, pertinent lab work and imaging were reviewed and interpreted as discussed in the ED course. Hospitalization was considered for further testing, treatments, or serial exams/observation. However as patient is well-appearing,  has a stable exam, and reassuring studies today, I do not feel that they warrant admission at this time. This plan was discussed with the patient who verbalizes agreement and comfort with this plan and seems  reliable and able to return to the Emergency Department with worsening or changing symptoms.          Final Clinical Impression(s) / ED Diagnoses Final diagnoses:  Viral URI with cough    Rx / DC Orders ED Discharge Orders          Ordered    predniSONE (DELTASONE) 20 MG tablet  Daily        03/07/23 2040    benzonatate (TESSALON) 100 MG capsule  Every 8 hours        03/07/23 2040              Renne Crigler, PA-C 03/07/23 2045    Rondel Baton, MD 03/09/23 1432

## 2023-03-07 NOTE — ED Triage Notes (Signed)
Pt complains of cough and SOB x 1 month. Pt went to UC today and was advised to come to ER. PT recently dx with pneumonia pt has not yet completed antibiotic. Pt says sats have been low. Also complains of urinating when coughing.

## 2023-03-07 NOTE — ED Notes (Signed)
Patient is being discharged from the Urgent Care and sent to the Emergency Department via Private Vehicle (Family) . Per R. Reggie Pile, patient is in need of higher level of care due to Complexity of Care needed. Patient is aware and verbalizes understanding of plan of care.  Vitals:   03/07/23 1458 03/07/23 1501  BP: 113/76   Pulse: 95 92  Resp: (!) 24 (!) 24  Temp: 97.8 F (36.6 C)   SpO2: 94% 95%     Note: Patient remained under observation as of 1517, awaiting to hear from family member regarding transport.

## 2023-03-07 NOTE — ED Provider Notes (Signed)
[facility]    CSN: [id number] Arrival date & time: 03/07/23  1359      History   Chief Complaint Chief Complaint  Patient presents with   Follow-up    HPI Kristi Ortiz is a 52 y.o. female.   Patient presents today for evaluation of worsening symptoms after being diagnosed with pneumonia 10 days ago.  She reports that she feels as if her cough is worsening and she does have mild tachypnea in office.  She denies any fever.  The history is provided by the patient.    Past Medical History:  Diagnosis Date   ADHD (attention deficit hyperactivity disorder)    ADD - took medications in elementary school   Allergy    Anxiety    Arthritis    Asthma Dx1990   Depression    GERD (gastroesophageal reflux disease)    occasional   Hypothyroid Dx 1994   Sleep apnea DX 2012   not using cpap, keeps head elevated   Sleep apnea    Sleep apnea    Sleep apnea    Vitamin D insufficiency 10/26/2013    Patient Active Problem List   Diagnosis Date Noted   Osteoarthritis of right knee 02/23/2019   Degeneration of lumbar intervertebral disc 02/23/2019   Lumbar spondylosis 02/23/2019   Lumbar pain 01/31/2019   Low back pain 01/24/2019   Pain in right knee 01/23/2019   Obstructive sleep apnea 01/16/2016   Cognitive impairment 05/16/2015   GAD (generalized anxiety disorder) 05/16/2015   MDD (major depressive disorder) 05/16/2015   Severe obesity (BMI >= 40) (HCC) 02/07/2014   IFG (impaired fasting glucose) 10/26/2013   Allergic rhinitis 12/15/2012   Cerumen impaction 12/15/2012   Hypothyroidism 09/01/2012   OSA (obstructive sleep apnea) 09/01/2012   Memory difficulty 09/01/2012   Asthma 09/01/2012   Edema 09/01/2012    Past Surgical History:  Procedure Laterality Date   DENTAL SURGERY  2015   Took removal   DILATATION & CURETTAGE/HYSTEROSCOPY WITH MYOSURE N/A 08/04/2022   Procedure: DILATATION & CURETTAGE;  Surgeon: [name], DO;  Location: [facility] OR;   Service: Gynecology;  Laterality: N/A;   HYSTEROSCOPY  08/04/2022   Procedure: HYSTEROSCOPY;  Surgeon: [name], DO;  Location: [facility] OR;  Service: Gynecology;;    OB History   No obstetric history on file.      Home Medications    Prior to Admission medications   Medication Sig Start Date End Date Taking? Authorizing Provider  albuterol (VENTOLIN HFA) 108 (90 Base) MCG/ACT inhaler Inhale 1-2 puffs into the lungs every 6 (six) hours as needed for wheezing or shortness of breath. Patient taking differently: Inhale 1-2 puffs into the lungs every 6 (six) hours as needed for wheezing or shortness of breath (Cough). 01/31/22  Yes Edwin Dada P, DO  amoxicillin-clavulanate (AUGMENTIN) 875-125 MG tablet Take 1 tablet by mouth every 12 (twelve) hours. Patient taking differently: Take 1 tablet by mouth every 12 (twelve) hours. Last dose: Tuesday. 02/25/23  Yes Tomi Bamberger, PA-C  cetirizine (ZYRTEC) 10 MG chewable tablet Chew 1 tablet (10 mg total) by mouth daily. 12/06/15  Yes Trena Platt D, PA  levothyroxine (SYNTHROID) 137 MCG tablet Take 137 mcg by mouth daily before breakfast. 07/01/16  Yes [provider]  montelukast (SINGULAIR) 10 MG tablet Take 10 mg by mouth in the morning.   Yes [provider]  Multiple Vitamin (MULTIVITAMIN) tablet Take 1 tablet by mouth daily. Centrum woman. Immune Suppliment.   Yes  [provider]  acetaminophen (TYLENOL) 325 MG tablet Take 325 mg by mouth every 6 (six) hours as needed for mild pain (pain score 1-3), moderate pain (pain score 4-6), fever or headache. 01/16/16   [provider]  albuterol (VENTOLIN HFA) 108 (90 Base) MCG/ACT inhaler Inhale 2 puffs into the lungs every 4 (four) hours as needed for wheezing or shortness of breath.    [provider]  azithromycin (ZITHROMAX) 250 MG tablet Take 1 tablet (250 mg total) by mouth daily. Take first 2 tablets together, then 1 every day until  finished. Patient taking differently: Take 250 mg by mouth daily. Take first 2 tablets together, then 1 every day until finished. Last dose: Thursday. 02/25/23   Tomi Bamberger, PA-C  Bismuth Subsalicylate (KAOPECTATE) 262 MG TABS Take 1-2 tablets by mouth daily as needed (Diarrhea).    [provider]  dextromethorphan-guaiFENesin (MUCINEX DM) 30-600 MG 12hr tablet Take 1 tablet by mouth 2 (two) times daily.    [provider]  DM-Phenylephrine-Acetaminophen (ALKA-SELTZER PLS SINUS & COUGH PO) Take by mouth.    [provider]  EUTHYROX 175 MCG tablet Take 175 mcg by mouth daily. 02/19/23   [provider]  hydrocortisone 2.5 % cream Apply 1 Application topically daily as needed (itching).    [provider]  ibuprofen (ADVIL) 200 MG tablet Take 200 mg by mouth every 6 (six) hours as needed.    [provider]  ibuprofen (ADVIL) 800 MG tablet Take 1 tablet (800 mg total) by mouth every 8 (eight) hours as needed for cramping, moderate pain or mild pain. 08/04/22   [name], DO  Lactobacillus (ACIDOPHILUS PO) Take 1 tablet by mouth at bedtime.    [provider]  lactobacillus acidophilus (BACID) TABS tablet Take 1 tablet by mouth at bedtime.    [provider]  Loperamide-Simethicone 2-125 MG TABS Take 1 tablet by mouth as needed.    [provider]  Menthol 5.8 MG LOZG Use as directed in the mouth or throat.    [provider]  montelukast (SINGULAIR) 10 MG tablet TAKE 1 TABLET BY MOUTH ONCE DAILY FOR 30 DAYS    [provider]  nystatin-triamcinolone (MYCOLOG II) cream Apply 1 Application topically 2 (two) times daily as needed (Rash). 03/31/22   [provider]    Family History Family History  Problem Relation Age of Onset   Diabetes Mother    Hypertension Mother    Psoriasis Father    Healthy Brother    Healthy Sister    Hyperlipidemia Sister    Hypertension Sister     Mental illness Maternal Grandmother    Cancer Maternal Grandfather    Mental illness Maternal Grandfather    Stroke Maternal Grandfather    Cancer Paternal Grandfather     Social History Social History   Tobacco Use   Smoking status: Never    Passive exposure: Past   Smokeless tobacco: Never  Vaping Use   Vaping status: Never Used  Substance Use Topics   Alcohol use: Yes    Comment: rare   Drug use: No     Allergies   Molds & smuts and Mold extract [trichophyton]   Review of Systems Review of Systems  Constitutional:  Negative for chills and fever.  HENT:  Positive for congestion. Negative for ear pain.   Eyes:  Negative for discharge and redness.  Respiratory:  Positive for cough and shortness of breath. Negative for wheezing.  Gastrointestinal:  Negative for abdominal pain, diarrhea, nausea and vomiting.     Physical Exam Triage Vital Signs ED Triage Vitals  Encounter Vitals Group     BP 03/07/23 1458 113/76     Systolic BP Percentile --      Diastolic BP Percentile --      Pulse Rate 03/07/23 1458 95     Resp 03/07/23 1458 (!) 24     Temp 03/07/23 1458 97.8 F (36.6 C)     Temp Source 03/07/23 1458 Oral     SpO2 03/07/23 1458 94 %     Weight 03/07/23 1452 264 lb 6.4 oz (119.9 kg)     Height 03/07/23 1452 5\' 5"  (1.651 m)     Head Circumference --      Peak Flow --      Pain Score 03/07/23 1449 0     Pain Loc --      Pain Education --      Exclude from Growth Chart --    No data found.  Updated Vital Signs BP 113/76 (BP Location: Left Arm)   Pulse 92   Temp 97.8 F (36.6 C) (Oral)   Resp (!) 24   Ht 5\' 5"  (1.651 m)   Wt 264 lb 6.4 oz (119.9 kg)   LMP 12/25/2015 (Approximate)   SpO2 95%   BMI 44.00 kg/m   Visual Acuity Right Eye Distance:   Left Eye Distance:   Bilateral Distance:    Right Eye Near:   Left Eye Near:    Bilateral Near:     Physical Exam Vitals and nursing note reviewed.  Constitutional:      General: She is not in  acute distress.    Appearance: Normal appearance. She is not ill-appearing.  HENT:     Head: Normocephalic and atraumatic.     Nose: Congestion present.     Mouth/Throat:     Mouth: Mucous membranes are moist.     Pharynx: No oropharyngeal exudate or posterior oropharyngeal erythema.  Eyes:     Conjunctiva/sclera: Conjunctivae normal.  Cardiovascular:     Rate and Rhythm: Normal rate and regular rhythm.     Heart sounds: Normal heart sounds. No murmur heard. Pulmonary:     Breath sounds: No wheezing, rhonchi or rales.     Comments: Mild tachypnea/shortness of breath Skin:    General: Skin is warm and dry.  Neurological:     Mental Status: She is alert.  Psychiatric:        Mood and Affect: Mood normal.        Thought Content: Thought content normal.      UC Treatments / Results  Labs (all labs ordered are listed, but only abnormal results are displayed) Labs Reviewed - No data to display  EKG   Radiology No results found.  Procedures Procedures (including critical care time)  Medications Ordered in UC Medications - No data to display  Initial Impression / Assessment and Plan / UC Course  I have reviewed the triage vital signs and the nursing notes.  Pertinent labs & imaging results that were available during my care of the patient were reviewed by me and considered in my medical decision making (see chart for details).    Given concerns of worsening symptoms after outpatient treatment for pneumonia recommended further evaluation in the emergency room.  Family member to transport via POV.  Final Clinical Impressions(s) / UC Diagnoses   Final diagnoses:  Pneumonia of left lower  lobe due to infectious organism   Discharge Instructions   None    ED Prescriptions   None    PDMP not reviewed this encounter.   Tomi Bamberger, PA-C 03/07/23 [phone]

## 2023-03-07 NOTE — ED Triage Notes (Signed)
Recently seen and diagnosed with Pna. "I feel like the Cough is worse and also having loose stools from the antibiotic". No fever. "Also having gas and reflux symptoms more with these symptoms".

## 2023-04-13 ENCOUNTER — Telehealth: Payer: Self-pay | Admitting: Gastroenterology

## 2023-04-13 NOTE — Telephone Encounter (Signed)
Good afternoon Dr. Tomasa Rand,  Supervising Provider: 04/13/23   We received a referral for patient to be evaluated for diarrhea,intermittent blood in stools. The patient is requesting a transfer from [facility] stated it is too far for her to travel. Records are available in Epic for you to review and advise.   Thank you

## 2023-06-10 ENCOUNTER — Encounter: Payer: Self-pay | Admitting: Pulmonary Disease

## 2023-06-10 ENCOUNTER — Ambulatory Visit (INDEPENDENT_AMBULATORY_CARE_PROVIDER_SITE_OTHER): Payer: Medicaid Other | Admitting: Pulmonary Disease

## 2023-06-10 VITALS — BP 132/80 | HR 106 | Ht 65.0 in | Wt 262.0 lb

## 2023-06-10 DIAGNOSIS — J4530 Mild persistent asthma, uncomplicated: Secondary | ICD-10-CM | POA: Diagnosis not present

## 2023-06-10 DIAGNOSIS — G4733 Obstructive sleep apnea (adult) (pediatric): Secondary | ICD-10-CM

## 2023-06-10 DIAGNOSIS — R0600 Dyspnea, unspecified: Secondary | ICD-10-CM

## 2023-06-10 MED ORDER — FLUTICASONE-SALMETEROL 115-21 MCG/ACT IN AERO: 2.0000 | INHALATION_SPRAY | Freq: Two times a day (BID) | RESPIRATORY_TRACT | 5 refills | Status: DC

## 2023-06-10 NOTE — Patient Instructions (Signed)
 I will see you in about 6 to 8 weeks  Schedule for pulmonary function test  Schedule for echocardiogram  Continue using your Advair, will increase the dose of Advair from 45-115-this is still 2 puffs twice a day  Use your albuterol as needed  It is important to start regular exercises as tolerated  Make sure you follow-up with your evaluation for your sleep as your sleep apnea may be playing a role in how you are feeling  Call us with significant concerns  Weight loss efforts as tolerated

## 2023-06-10 NOTE — Progress Notes (Signed)
 Kristi Ortiz    [id number]    [birth year]  Primary Care Physician:Sun, Charise Carwin, MD  Referring Physician: Deatra James, MD [phone] [address] [place],  [place] [address]  Chief complaint:   Patient seen for shortness of breath  HPI:  Shortness of breath on exertion  Recent treatment for bronchitis  Adult onset asthma, on Advair 45, albuterol as needed Occasional wheezes Occasional nasal stuffiness congestion, cough  Sometimes feels lung is inflamed  Gives history of chronically shallow breathing  Did not have asthma labeled growing up  Past history of obstructive sleep apnea diagnosed in about 1997, had another study in 2013 Should be on CPAP but not using CPAP currently Elevate head of the bed  Still does have snoring, occasional dryness of the mouth, occasional headaches Memory is fair Usually tries to go to bed about 10 PM sometimes take hours to fall asleep, time out of bed very significantly -She states she does have an appointment with a provider to evaluate her for sleep  She does have pet dogs  -Works as a Conservation officer, nature, different establishments  She states she can walk a mile if she tries to, can go up 20 steps without stopping   Outpatient Encounter Medications as of 06/10/2023  Medication Sig   acetaminophen (TYLENOL) 325 MG tablet Take 325 mg by mouth every 6 (six) hours as needed for mild pain (pain score 1-3), moderate pain (pain score 4-6), fever or headache.   albuterol (VENTOLIN HFA) 108 (90 Base) MCG/ACT inhaler Inhale 1-2 puffs into the lungs every 6 (six) hours as needed for wheezing or shortness of breath. (Patient taking differently: Inhale 1-2 puffs into the lungs every 6 (six) hours as needed for wheezing or shortness of breath (Cough).)   albuterol (VENTOLIN HFA) 108 (90 Base) MCG/ACT inhaler Inhale 2 puffs into the lungs every 4 (four) hours as needed for wheezing or shortness of breath.   amoxicillin-clavulanate (AUGMENTIN)  875-125 MG tablet Take 1 tablet by mouth every 12 (twelve) hours. (Patient taking differently: Take 1 tablet by mouth every 12 (twelve) hours. Last dose: Tuesday.)   Bismuth Subsalicylate (KAOPECTATE) 262 MG TABS Take 1-2 tablets by mouth daily as needed (Diarrhea).   cetirizine (ZYRTEC) 10 MG chewable tablet Chew 1 tablet (10 mg total) by mouth daily.   EUTHYROX 175 MCG tablet Take 175 mcg by mouth daily.   hydrocortisone 2.5 % cream Apply 1 Application topically daily as needed (itching).   ibuprofen (ADVIL) 200 MG tablet Take 200 mg by mouth every 6 (six) hours as needed.   ibuprofen (ADVIL) 800 MG tablet Take 1 tablet (800 mg total) by mouth every 8 (eight) hours as needed for cramping, moderate pain or mild pain.   lactobacillus acidophilus (BACID) TABS tablet Take 1 tablet by mouth at bedtime.   levothyroxine (SYNTHROID) 137 MCG tablet Take 137 mcg by mouth daily before breakfast.   Menthol 5.8 MG LOZG Use as directed in the mouth or throat.   montelukast (SINGULAIR) 10 MG tablet TAKE 1 TABLET BY MOUTH ONCE DAILY FOR 30 DAYS   montelukast (SINGULAIR) 10 MG tablet Take 10 mg by mouth in the morning.   Multiple Vitamin (MULTIVITAMIN) tablet Take 1 tablet by mouth daily. Centrum woman. Immune Suppliment.   nystatin-triamcinolone (MYCOLOG II) cream Apply 1 Application topically 2 (two) times daily as needed (Rash).   azithromycin (ZITHROMAX) 250 MG tablet Take 1 tablet (250 mg total) by mouth daily. Take first 2  tablets together, then 1 every day until finished. (Patient taking differently: Take 250 mg by mouth daily. Take first 2 tablets together, then 1 every day until finished. Last dose: Thursday.)   benzonatate (TESSALON) 100 MG capsule Take 1 capsule (100 mg total) by mouth every 8 (eight) hours.   dextromethorphan-guaiFENesin (MUCINEX DM) 30-600 MG 12hr tablet Take 1 tablet by mouth 2 (two) times daily.   DM-Phenylephrine-Acetaminophen (ALKA-SELTZER PLS SINUS & COUGH PO) Take by mouth.    Lactobacillus (ACIDOPHILUS PO) Take 1 tablet by mouth at bedtime.   Loperamide-Simethicone 2-125 MG TABS Take 1 tablet by mouth as needed.   predniSONE (DELTASONE) 20 MG tablet Take 2 tablets (40 mg total) by mouth daily.   No facility-administered encounter medications on file as of 06/10/2023.    Allergies as of 06/10/2023 - Review Complete 06/10/2023  Allergen Reaction Noted   Molds & smuts  10/05/2022   Mold extract [trichophyton] Other (See Comments) 04/06/2013    Past Medical History:  Diagnosis Date   ADHD (attention deficit hyperactivity disorder)    ADD - took medications in elementary school   Allergy    Anxiety    Arthritis    Asthma Dx1990   Depression    GERD (gastroesophageal reflux disease)    occasional   Hypothyroid Dx 1994   Sleep apnea DX 2012   not using cpap, keeps head elevated   Sleep apnea    Sleep apnea    Sleep apnea    Vitamin D insufficiency 10/26/2013    Past Surgical History:  Procedure Laterality Date   DENTAL SURGERY  2015   Took removal   DILATATION & CURETTAGE/HYSTEROSCOPY WITH MYOSURE N/A 08/04/2022   Procedure: DILATATION & CURETTAGE;  Surgeon: [name], DO;  Location: [facility] OR;  Service: Gynecology;  Laterality: N/A;   HYSTEROSCOPY  08/04/2022   Procedure: HYSTEROSCOPY;  Surgeon: [name], DO;  Location: [facility] OR;  Service: Gynecology;;    Family History  Problem Relation Age of Onset   Diabetes Mother    Hypertension Mother    Psoriasis Father    Healthy Brother    Healthy Sister    Hyperlipidemia Sister    Hypertension Sister    Mental illness Maternal Grandmother    Cancer Maternal Grandfather    Mental illness Maternal Grandfather    Stroke Maternal Grandfather    Cancer Paternal Grandfather     Social History   Socioeconomic History   Marital status: Single    Spouse name: Not on file   Number of children: 0   Years of education: 16   Highest education level: Not on file  Occupational History    Occupation: Water engineer  Tobacco Use   Smoking status: Never    Passive exposure: Past   Smokeless tobacco: Never  Vaping Use   Vaping status: Never Used  Substance and Sexual Activity   Alcohol use: Yes    Comment: rare   Drug use: No   Sexual activity: Not Currently  Other Topics Concern   Not on file  Social History Narrative   She lives with parents.   She is currently unemployed and has been working on a manuscript (fiction).  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   Caffeine use: Drinks soda caffeine free, chocolate    Decaf coffee rarely    Drinks water mostly       Social Drivers  of Health   Financial Resource Strain: Not on file  Food Insecurity: Not on file  Transportation Needs: Not on file  Physical Activity: Not on file  Stress: Not on file  Social Connections: Not on file  Intimate Partner Violence: Not on file    Review of Systems  HENT:  Positive for congestion.   Respiratory:  Positive for apnea, cough and shortness of breath.   Psychiatric/Behavioral:  Positive for sleep disturbance.     Vitals:   06/10/23 0929  BP: 132/80  Pulse: (!) 106  SpO2: 94%     Physical Exam Constitutional:      Appearance: She is obese.  HENT:     Head: Normocephalic.     Mouth/Throat:     Mouth: Mucous membranes are moist.     Comments: Crowded oropharynx Eyes:     General: No scleral icterus. Cardiovascular:     Rate and Rhythm: Normal rate and regular rhythm.     Heart sounds: No murmur heard.    No friction rub.  Pulmonary:     Effort: No respiratory distress.     Breath sounds: No stridor. No wheezing or rhonchi.  Musculoskeletal:     Cervical back: No rigidity or tenderness.  Neurological:     Mental Status: She is alert.  Psychiatric:        Mood and Affect: Mood normal.    Data Reviewed: Echocardiogram June 2014 with diastolic dysfunction  Sleep  study 12/27/2015 shows severe obstructive sleep apnea with AHI of 61 -Titration study recommended  Assessment:  Shortness of breath on exertion  Mild persistent asthma  Recent bronchitis  Untreated severe obstructive sleep apnea  Diastolic dysfunction on previous echo  Class III obesity  Hypothyroidism  History of major depression, generalized anxiety disorder  Symptoms are shortness of breath likely multifactorial Deconditioning is likely playing a role Optimizing inhaler should help symptoms, Weight loss will play significant role  Plan/Recommendations:  Needs further evaluation for her shortness of breath  Schedule patient for pulmonary function test  Schedule for echocardiogram  Graded exercises as tolerated  Needs further evaluation for sleep disordered breathing and does need treated, likely still has significant sleep disordered breathing  Needs to optimize sleep hygiene  Continue current inhalers, dose of Advair increased from 45-115 to be used 2 puffs twice a day  Use albuterol as needed  Importance of regular exercises discussed with patient  Follow-up in 6 to 8 weeks  Encouraged to call with significant concerns  Untreated severe sleep disordered breathing places her at a significant morbidity risk  Virl Diamond MD [facility] 06/10/2023, 9:36 AM  CC: Deatra James, MD

## 2023-06-11 ENCOUNTER — Ambulatory Visit: Payer: Medicaid Other | Admitting: Physician Assistant

## 2023-06-11 ENCOUNTER — Encounter: Payer: Self-pay | Admitting: Physician Assistant

## 2023-06-11 VITALS — BP 108/72 | HR 90 | Ht 65.0 in | Wt 263.1 lb

## 2023-06-11 DIAGNOSIS — K219 Gastro-esophageal reflux disease without esophagitis: Secondary | ICD-10-CM

## 2023-06-11 DIAGNOSIS — K625 Hemorrhage of anus and rectum: Secondary | ICD-10-CM | POA: Diagnosis not present

## 2023-06-11 DIAGNOSIS — R194 Change in bowel habit: Secondary | ICD-10-CM | POA: Diagnosis not present

## 2023-06-11 DIAGNOSIS — R1031 Right lower quadrant pain: Secondary | ICD-10-CM

## 2023-06-11 NOTE — Patient Instructions (Addendum)
 Start Benefiber or Citrucel 2 teaspoons in 8 ounces of liquid daily and may increase to twice daily if tolerated.   _______________________________________________________  If your blood pressure at your visit was 140/90 or greater, please contact your primary care physician to follow up on this.  _______________________________________________________  If you are age 52 or older, your body mass index should be between 23-30. Your Body mass index is 43.79 kg/m. If this is out of the aforementioned range listed, please consider follow up with your Primary Care Provider.  If you are age 50 or younger, your body mass index should be between 19-25. Your Body mass index is 43.79 kg/m. If this is out of the aformentioned range listed, please consider follow up with your Primary Care Provider.   ________________________________________________________  The [facility] providers would like to encourage you to use [facility] to communicate with providers for non-urgent requests or questions.  Due to long hold times on the telephone, sending your provider a message by [facility] may be a faster and more efficient way to get a response.  Please allow 48 business hours for a response.  Please remember that this is for non-urgent requests.  _______________________________________________________

## 2023-06-11 NOTE — Progress Notes (Signed)
 Chief Complaint: Diarrhea and intermittent blood in stools  HPI:    Kristi Ortiz is a 53 year old female with a past medical history as listed below, who had a transfer of care to Dr. Tomasa Rand, who was referred to me by Deatra James, MD for a complaint of diarrhea and intermittent blood in stools.      10/05/2022 patient seen by [facility] at that time discussed 4-5 bowel movements per day with fecal urgency and some abdominal cramping as well as occasional bloody stools, stool studies previously negative.  At that time recommended colonoscopy and endoscopy.  Recommended to take fiber and IBgard also recommended to schedule colonoscopy.    10/05/2022 celiac testing negative/normal.    03/07/2023 CBC normal, CMP with a minimally elevated glucose and otherwise normal.    06/10/2023 patient seen by pulmonology for shortness of breath.  Had recent treatment for bronchitis.  Also has adult onset asthma on Advair.,  OSA diagnosed in 97, but not on CPAP.  She was scheduled for pulmonary function test and echocardiogram.    Today, the patient is a difficult historian, tells me that occasionally she will have lower abdominal cramping and typically this results in a bowel movement and then the cramping goes away.  The stools can be anywhere from solid to loose and she typically has 2-3 a day.  This is unchanged over the past 7 to 8 months.  She has been seeing some bright red blood with bowel movements here and there.  Also occasional heartburn and reflux.  She was seen by [facility] and recommended to have an EGD and colonoscopy but this was too far for her to travel.    Denies fever, chills or weight loss.  Past Medical History:  Diagnosis Date   ADHD (attention deficit hyperactivity disorder)    ADD - took medications in elementary school   Allergy    Anxiety    Arthritis    Asthma Dx1990   Depression    GERD (gastroesophageal reflux disease)    occasional   Hypothyroid Dx 1994   Sleep apnea DX  2012   not using cpap, keeps head elevated   Sleep apnea    Sleep apnea    Sleep apnea    Vitamin D insufficiency 10/26/2013    Past Surgical History:  Procedure Laterality Date   DENTAL SURGERY  2015   Took removal   DILATATION & CURETTAGE/HYSTEROSCOPY WITH MYOSURE N/A 08/04/2022   Procedure: DILATATION & CURETTAGE;  Surgeon: [name], DO;  Location: [facility];  Service: Gynecology;  Laterality: N/A;   HYSTEROSCOPY  08/04/2022   Procedure: HYSTEROSCOPY;  Surgeon: [name], DO;  Location: [facility];  Service: Gynecology;;    Current Outpatient Medications  Medication Sig Dispense Refill   acetaminophen (TYLENOL) 325 MG tablet Take 325 mg by mouth every 6 (six) hours as needed for mild pain (pain score 1-3), moderate pain (pain score 4-6), fever or headache.     albuterol (VENTOLIN HFA) 108 (90 Base) MCG/ACT inhaler Inhale 1-2 puffs into the lungs every 6 (six) hours as needed for wheezing or shortness of breath. (Patient taking differently: Inhale 1-2 puffs into the lungs every 6 (six) hours as needed for wheezing or shortness of breath (Cough).) 1 each 0   albuterol (VENTOLIN HFA) 108 (90 Base) MCG/ACT inhaler Inhale 2 puffs into the lungs every 4 (four) hours as needed for wheezing or shortness of breath.     amoxicillin-clavulanate (AUGMENTIN) 875-125 MG tablet Take 1  tablet by mouth every 12 (twelve) hours. (Patient taking differently: Take 1 tablet by mouth every 12 (twelve) hours. Last dose: Tuesday.) 14 tablet 0   Bismuth Subsalicylate (KAOPECTATE) 262 MG TABS Take 1-2 tablets by mouth daily as needed (Diarrhea).     cetirizine (ZYRTEC) 10 MG chewable tablet Chew 1 tablet (10 mg total) by mouth daily. 90 tablet 3   DM-Phenylephrine-Acetaminophen (ALKA-SELTZER PLS SINUS & COUGH PO) Take by mouth.     EUTHYROX 175 MCG tablet Take 175 mcg by mouth daily.     fluticasone-salmeterol (ADVAIR HFA) 115-21 MCG/ACT inhaler Inhale 2 puffs into the lungs 2 (two) times daily. 1 each 5    hydrocortisone 2.5 % cream Apply 1 Application topically daily as needed (itching).     ibuprofen (ADVIL) 200 MG tablet Take 200 mg by mouth every 6 (six) hours as needed.     ibuprofen (ADVIL) 800 MG tablet Take 1 tablet (800 mg total) by mouth every 8 (eight) hours as needed for cramping, moderate pain or mild pain. 30 tablet 0   lactobacillus acidophilus (BACID) TABS tablet Take 1 tablet by mouth at bedtime.     levothyroxine (SYNTHROID) 137 MCG tablet Take 137 mcg by mouth daily before breakfast.     Menthol 5.8 MG LOZG Use as directed in the mouth or throat.     montelukast (SINGULAIR) 10 MG tablet TAKE 1 TABLET BY MOUTH ONCE DAILY FOR 30 DAYS     montelukast (SINGULAIR) 10 MG tablet Take 10 mg by mouth in the morning.     Multiple Vitamin (MULTIVITAMIN) tablet Take 1 tablet by mouth daily. Centrum woman. Immune Suppliment.     nystatin-triamcinolone (MYCOLOG II) cream Apply 1 Application topically 2 (two) times daily as needed (Rash).     No current facility-administered medications for this visit.    Allergies as of 06/11/2023 - Review Complete 06/10/2023  Allergen Reaction Noted   Molds & smuts  10/05/2022   Mold extract [trichophyton] Other (See Comments) 04/06/2013    Family History  Problem Relation Age of Onset   Diabetes Mother    Hypertension Mother    Psoriasis Father    Healthy Brother    Healthy Sister    Hyperlipidemia Sister    Hypertension Sister    Mental illness Maternal Grandmother    Cancer Maternal Grandfather    Mental illness Maternal Grandfather    Stroke Maternal Grandfather    Cancer Paternal Grandfather     Social History   Socioeconomic History   Marital status: Single    Spouse name: Not on file   Number of children: 0   Years of education: 16   Highest education level: Not on file  Occupational History   Occupation: Water engineer  Tobacco Use   Smoking status: Never    Passive exposure: Past   Smokeless tobacco: Never  Vaping Use    Vaping status: Never Used  Substance and Sexual Activity   Alcohol use: Yes    Comment: rare   Drug use: No   Sexual activity: Not Currently  Other Topics Concern   Not on file  Social History Narrative   She lives with parents.   She is currently unemployed and has been working on a manuscript (fiction).  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   Caffeine use: Drinks soda caffeine free, chocolate    Decaf coffee rarely  Drinks water mostly       Social Drivers of Corporate investment banker Strain: Not on file  Food Insecurity: Not on file  Transportation Needs: Not on file  Physical Activity: Not on file  Stress: Not on file  Social Connections: Not on file  Intimate Partner Violence: Not on file    Review of Systems:    Constitutional: No weight loss, fever or chills Skin: No rash  Cardiovascular: No chest pain Respiratory: No SOB  Gastrointestinal: See HPI and otherwise negative Genitourinary: No dysuria Neurological: No headache, dizziness or syncope Musculoskeletal: No new muscle or joint pain Hematologic: No bruising Psychiatric: No history of depression or anxiety   Physical Exam:  Vital signs: BP 108/72   Pulse 90   Ht 5\' 5"  (1.651 m)   Wt 263 lb 2 oz (119.4 kg)   LMP 12/25/2015 (Approximate)   BMI 43.79 kg/m    Constitutional:   Pleasant obese Caucasian female appears to be in NAD, Well developed, Well nourished, alert and cooperative Head:  Normocephalic and atraumatic. Eyes:   PEERL, EOMI. No icterus. Conjunctiva pink. Ears:  Normal auditory acuity. Neck:  Supple Throat: Oral cavity and pharynx without inflammation, swelling or lesion.  Respiratory: Respirations even and unlabored. Lungs clear to auscultation bilaterally.   No wheezes, crackles, or rhonchi.  Cardiovascular: Normal S1, S2. No MRG. Regular rate and rhythm. No peripheral edema, cyanosis or  pallor.  Gastrointestinal:  Soft, nondistended, nontender. No rebound or guarding. Normal bowel sounds. No appreciable masses or hepatomegaly. Rectal:  Declined Msk:  Symmetrical without gross deformities. Without edema, no deformity or joint abnormality.  Neurologic:  Alert and  oriented x4;  grossly normal neurologically.  Skin:   Dry and intact without significant lesions or rashes. Psychiatric: Demonstrates good judgement and reason without abnormal affect or behaviors.  RELEVANT LABS AND IMAGING: CBC    Component Value Date/Time   WBC 6.2 03/07/2023 1610   RBC 4.48 03/07/2023 1610   HGB 12.1 03/07/2023 1610   HCT 38.9 03/07/2023 1610   PLT 323 03/07/2023 1610   MCV 86.8 03/07/2023 1610   MCV 82.4 10/23/2015 1053   MCH 27.0 03/07/2023 1610   MCHC 31.1 03/07/2023 1610   RDW 15.1 03/07/2023 1610   LYMPHSABS 1.7 03/07/2023 1610   MONOABS 0.8 03/07/2023 1610   EOSABS 0.4 03/07/2023 1610   BASOSABS 0.0 03/07/2023 1610    CMP     Component Value Date/Time   NA 140 03/07/2023 1610   K 3.7 03/07/2023 1610   CL 105 03/07/2023 1610   CO2 25 03/07/2023 1610   GLUCOSE 107 (H) 03/07/2023 1610   BUN 6 03/07/2023 1610   CREATININE 0.70 03/07/2023 1610   CREATININE 0.53 10/23/2015 1042   CALCIUM 8.9 03/07/2023 1610   PROT 6.1 10/23/2015 1042   ALBUMIN 3.8 10/23/2015 1042   AST 12 10/23/2015 1042   ALT 10 10/23/2015 1042   ALKPHOS 53 10/23/2015 1042   BILITOT 0.4 10/23/2015 1042   GFRNONAA >60 03/07/2023 1610   GFRNONAA >89 10/23/2015 1042   GFRAA >89 10/23/2015 1042    Assessment: 1.  Change in bowel habits: Seems to radiate back-and-forth from diarrhea to constipation; likely diet related +/- IBS 2.  Rectal bleeding: For the past 7 to 8 months off-and-on with a bowel movement, no rectal pain, declined rectal exam today, no prior colonoscopy; likely hemorrhoids, but will need a colonoscopy to rule out hemorrhoids versus other 3.  GERD: Very occasional typically related  to  the food that she is eating; likely reactive gastritis  Plan: 1.  Offered rectal exam today but the patient declined. 2.  Patient will need eventual EGD and colonoscopy for further evaluation, but currently being worked up for shortness of breath by pulmonology with an upcoming echo and pulmonary function test.  Will wait until this evaluation to schedule her for anesthesia. 3.  Today recommended a fiber supplement such as Metamucil, Citrucel or Benefiber daily. 4.  Reviewed antireflux diet and lifestyle modifications. 5.  Patient to follow in clinic in the in 2 to 3 months.  Hopefully at that time her shortness of breath will been worked out and she can be scheduled for procedures.  She will call if anything gets worse in the meantime.  Hyacinth Meeker, PA-C [facility] 06/11/2023, 10:42 AM  Cc: Deatra James, MD

## 2023-06-14 ENCOUNTER — Other Ambulatory Visit (HOSPITAL_COMMUNITY): Payer: Self-pay

## 2023-06-14 ENCOUNTER — Telehealth: Payer: Self-pay

## 2023-06-14 NOTE — Telephone Encounter (Signed)
*  Pulm  Pharmacy Patient Advocate Encounter   Received notification from Fax that prior authorization for Advair HFA is required/requested.   Insurance verification completed.   The patient is insured through  [facility]  .   Per test claim: Refill too soon. PA is not needed at this time. Medication was filled 06/10/2023. Next eligible fill date is 07/03/2023.

## 2023-06-22 NOTE — Progress Notes (Signed)
 Agree with the assessment and plan as outlined by Hyacinth Meeker, PA-C.  No urgent indication for endoscopic procedures.  Chronic intermittent rectal bleeding sounds most consistent with hemorrhoidal bleeding, but colonoscopy warranted.  Question need for upper endoscopy at all based on reported mild occasional GERD symptoms.    Nakeya Adinolfi E. Tomasa Rand, MD

## 2023-07-01 IMAGING — DX DG CHEST 2V
2 series · 2 of 2 positions shown · non-contrast
Comparison: 04/04/2009

CLINICAL DATA: Cough for 2 weeks

EXAM:
CHEST - 2 VIEW

[chest pa]
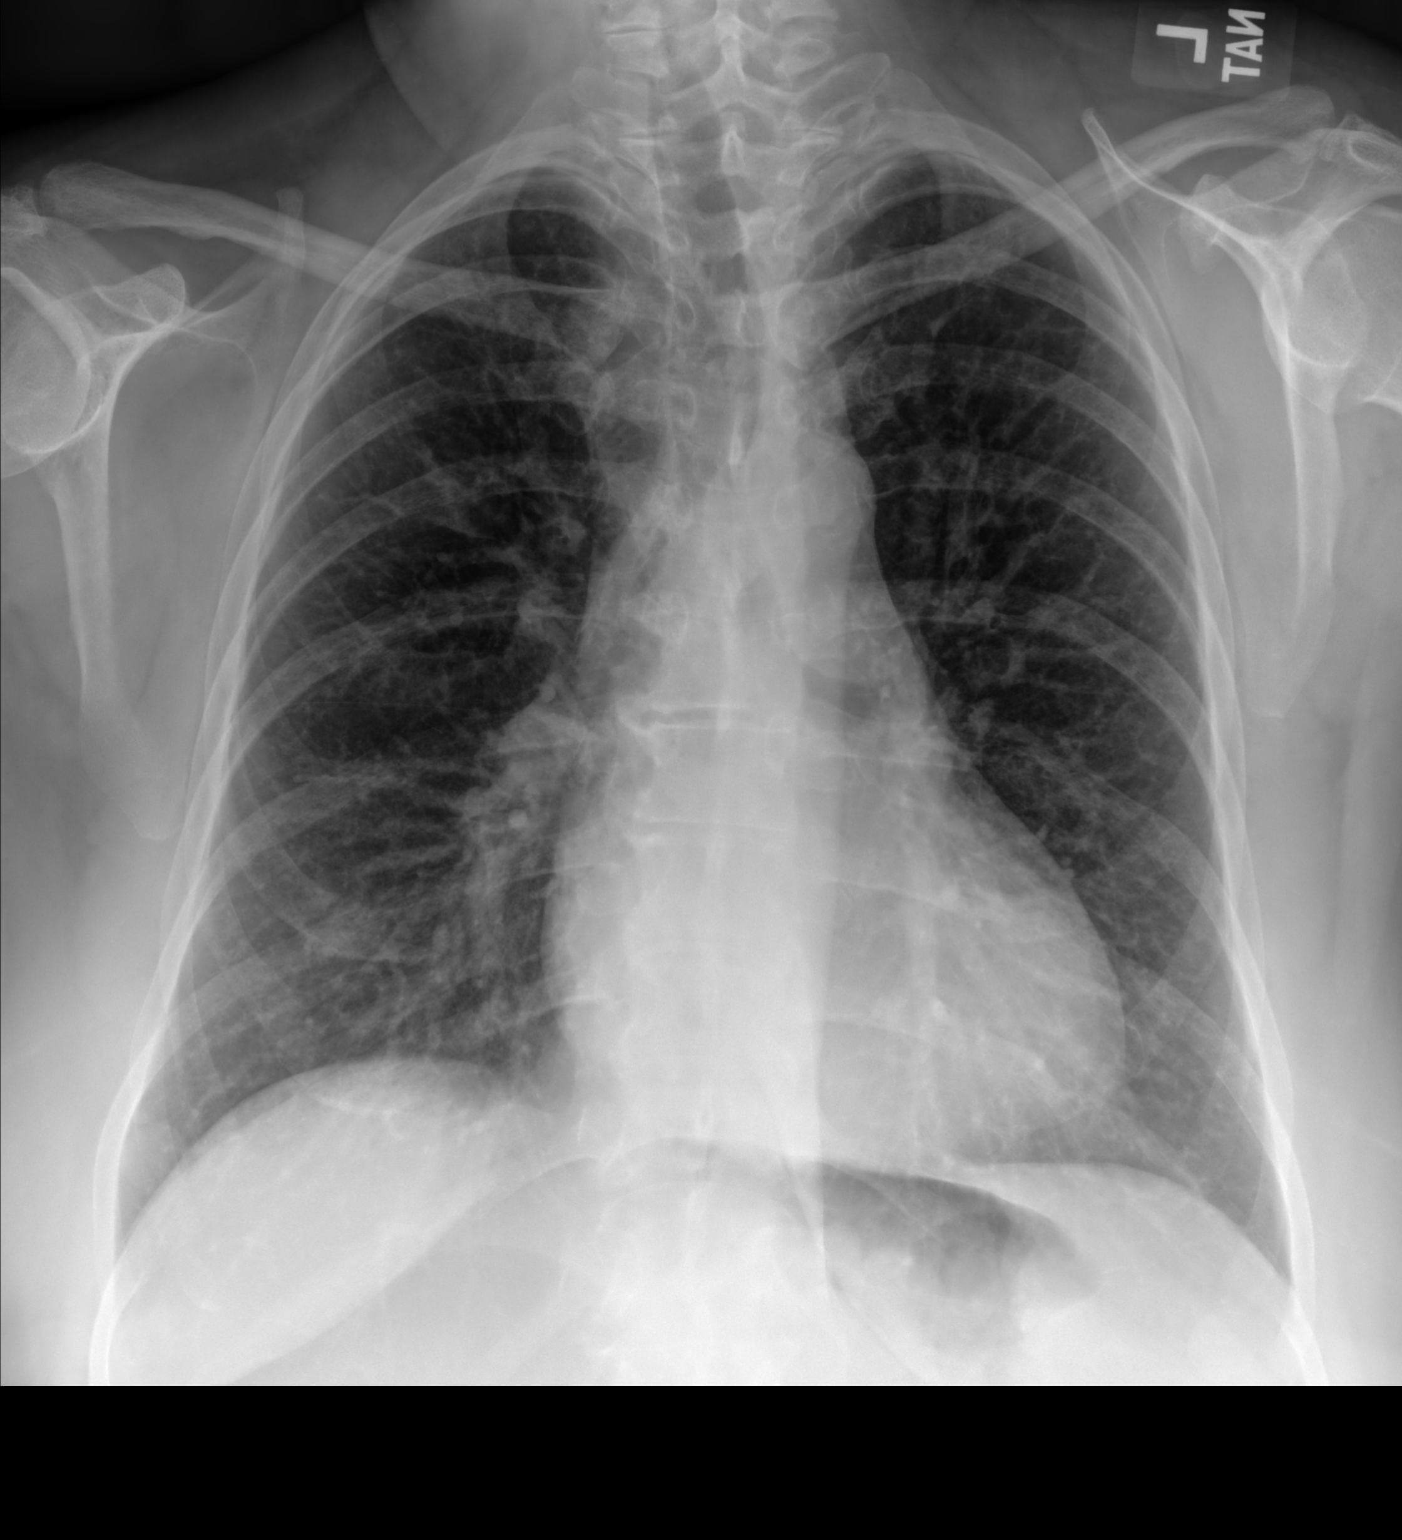

[chest lat]
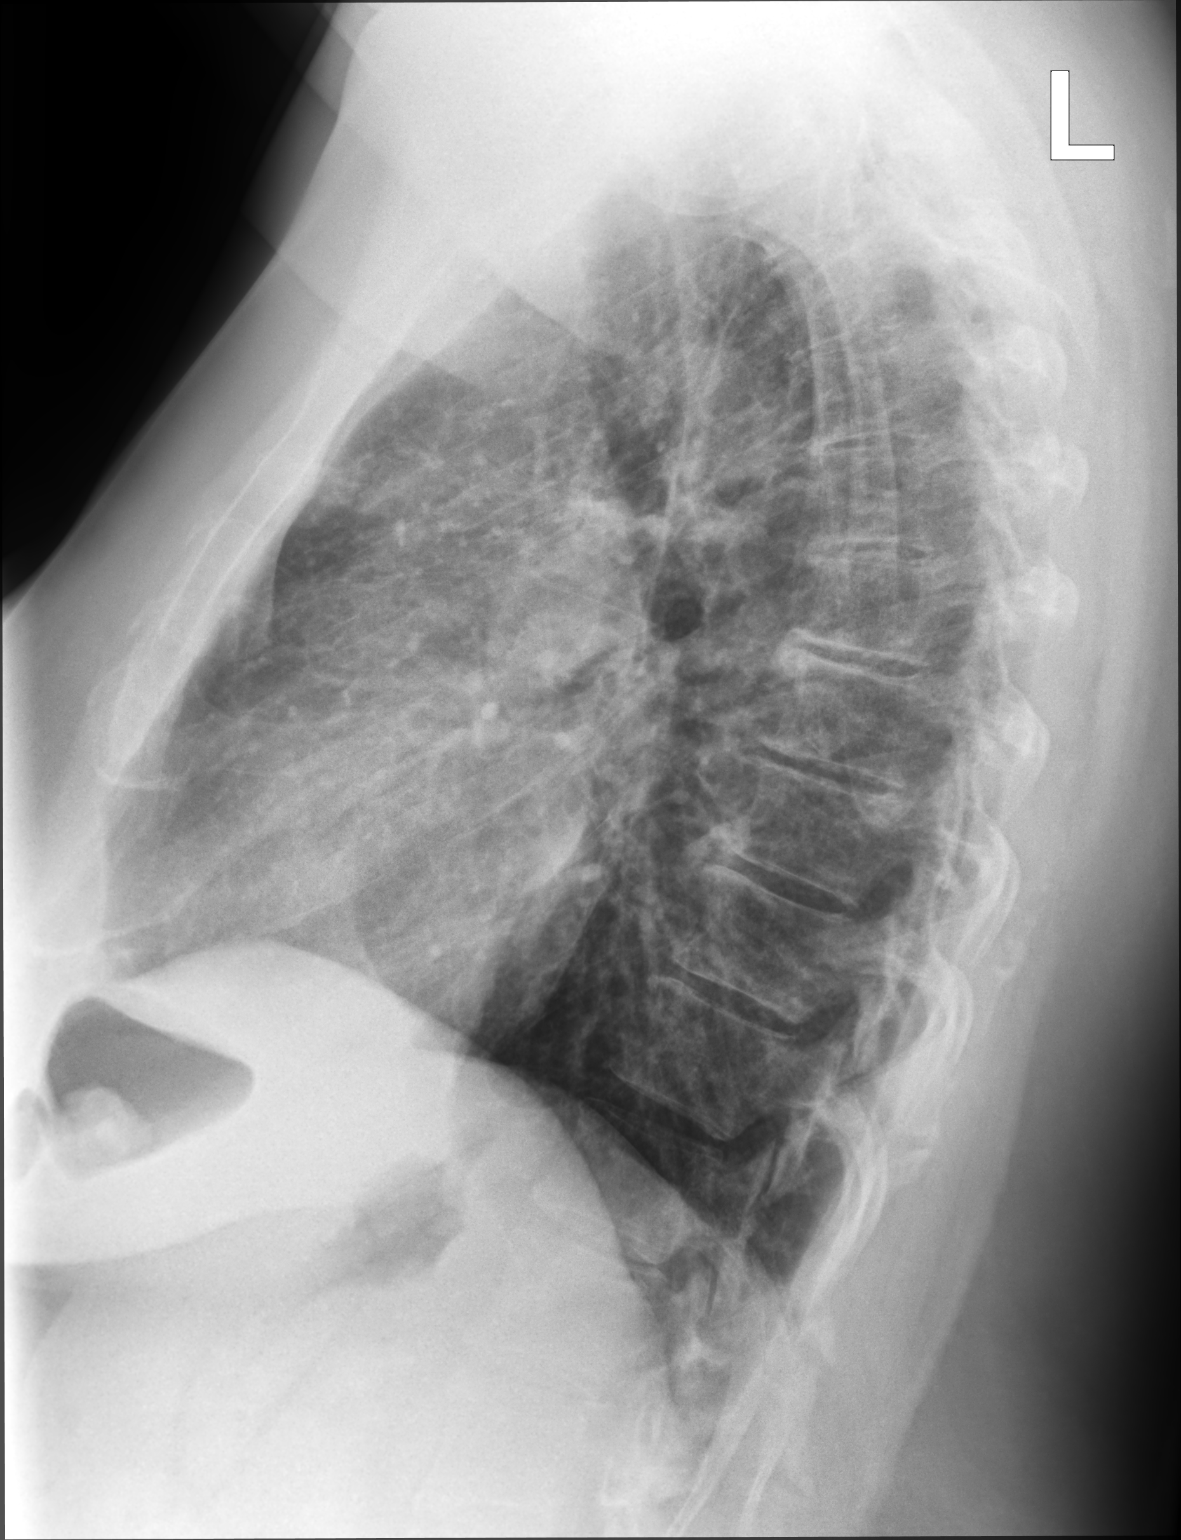

[2 of 2 positions shown; findings below may reference images not displayed]

FINDINGS: Normal mediastinum and cardiac silhouette. Chronic central
bronchitic markings. Normal pulmonary vasculature. No effusion,
infiltrate, or pneumothorax.
IMPRESSION: Chronic bronchitic markings.  No acute findings

## 2023-07-19 ENCOUNTER — Ambulatory Visit (HOSPITAL_COMMUNITY): Attending: Cardiovascular Disease

## 2023-07-19 DIAGNOSIS — R0600 Dyspnea, unspecified: Secondary | ICD-10-CM | POA: Diagnosis present

## 2023-07-19 LAB — ECHOCARDIOGRAM COMPLETE
Area-P 1/2: 4.31 cm2
S' Lateral: 2.6 cm

## 2023-08-12 ENCOUNTER — Ambulatory Visit: Admitting: Pulmonary Disease

## 2023-08-12 ENCOUNTER — Encounter: Payer: Self-pay | Admitting: Pulmonary Disease

## 2023-08-12 VITALS — BP 108/72 | HR 86 | Ht 65.0 in | Wt 265.0 lb

## 2023-08-12 DIAGNOSIS — E039 Hypothyroidism, unspecified: Secondary | ICD-10-CM | POA: Diagnosis not present

## 2023-08-12 DIAGNOSIS — Z6841 Body Mass Index (BMI) 40.0 and over, adult: Secondary | ICD-10-CM

## 2023-08-12 DIAGNOSIS — E66813 Obesity, class 3: Secondary | ICD-10-CM | POA: Diagnosis not present

## 2023-08-12 DIAGNOSIS — R0602 Shortness of breath: Secondary | ICD-10-CM | POA: Diagnosis not present

## 2023-08-12 DIAGNOSIS — J441 Chronic obstructive pulmonary disease with (acute) exacerbation: Secondary | ICD-10-CM

## 2023-08-12 NOTE — Progress Notes (Signed)
 Kristi Ortiz    [id number]    May 11, 1970  Primary Care Physician:Sun, Vyvyan, MD  Referring Physician: Sun, Vyvyan, MD [phone] [place],  [place] [address]  Chief complaint:   Patient seen for shortness of breath  HPI:  Shortness of breath on exertion  Breathing feels about the same Did have a recent treatment for bronchitis  She is on Advair which she is compliant with  Rarely needs albuterol   Recently had a sleep study performed by Cherene Core, noted to have severe obstructive sleep apnea with desaturations She does have an appointment for follow-up in the next week - I do not have a copy of the result  No history of asthma growing up  History of chronic hypoventilation,  Past history of obstructive sleep apnea diagnosed in about 1997, had another study in 2013 Should be on CPAP but not using CPAP currently Elevate head of the bed  Still does have snoring, occasional dryness of the mouth, occasional headaches Memory is fair Usually tries to go to bed about 10 PM sometimes take hours to fall asleep, time out of bed very significantly  She does have pet dogs  -Works as a Conservation officer, nature, different establishments  She states she can walk a mile if she tries to, can go up 20 steps without stopping   Outpatient Encounter Medications as of 08/12/2023  Medication Sig   acetaminophen  (TYLENOL ) 325 MG tablet Take 325 mg by mouth every 6 (six) hours as needed for mild pain (pain score 1-3), moderate pain (pain score 4-6), fever or headache.   albuterol  (VENTOLIN  HFA) 108 (90 Base) MCG/ACT inhaler Inhale 1-2 puffs into the lungs every 6 (six) hours as needed for wheezing or shortness of breath. (Patient taking differently: Inhale 1-2 puffs into the lungs every 6 (six) hours as needed for wheezing or shortness of breath (Cough).)   cetirizine  (ZYRTEC ) 10 MG chewable tablet Chew 1 tablet (10 mg total) by mouth daily.   fluticasone -salmeterol (ADVAIR HFA)  115-21 MCG/ACT inhaler Inhale 2 puffs into the lungs 2 (two) times daily.   hydrocortisone 2.5 % cream Apply 1 Application topically daily as needed (itching).   ibuprofen  (ADVIL ) 200 MG tablet Take 200 mg by mouth every 6 (six) hours as needed.   lactobacillus acidophilus (BACID) TABS tablet Take 1 tablet by mouth at bedtime.   Menthol 5.8 MG LOZG Use as directed in the mouth or throat.   metFORMIN  (GLUCOPHAGE -XR) 500 MG 24 hr tablet Take 500 mg by mouth daily.   montelukast (SINGULAIR) 10 MG tablet Take 10 mg by mouth in the morning.   nystatin-triamcinolone  (MYCOLOG II) cream Apply 1 Application topically 2 (two) times daily as needed (Rash).   albuterol  (VENTOLIN  HFA) 108 (90 Base) MCG/ACT inhaler Inhale 2 puffs into the lungs every 4 (four) hours as needed for wheezing or shortness of breath. (Patient not taking: Reported on 08/12/2023)   amoxicillin -clavulanate (AUGMENTIN ) 875-125 MG tablet Take 1 tablet by mouth every 12 (twelve) hours. (Patient not taking: Reported on 08/12/2023)   Bismuth Subsalicylate (KAOPECTATE) 262 MG TABS Take 1-2 tablets by mouth daily as needed (Diarrhea). (Patient not taking: Reported on 08/12/2023)   EUTHYROX  175 MCG tablet Take 175 mcg by mouth daily.   ibuprofen  (ADVIL ) 800 MG tablet Take 1 tablet (800 mg total) by mouth every 8 (eight) hours as needed for cramping, moderate pain or mild pain. (Patient not taking: Reported on 08/12/2023)   levothyroxine  (SYNTHROID ) 137  MCG tablet Take 137 mcg by mouth daily before breakfast. (Patient not taking: Reported on 08/12/2023)   montelukast (SINGULAIR) 10 MG tablet TAKE 1 TABLET BY MOUTH ONCE DAILY FOR 30 DAYS (Patient not taking: Reported on 08/12/2023)   Multiple Vitamin (MULTIVITAMIN) tablet Take 1 tablet by mouth daily. Centrum woman. Immune Suppliment. (Patient not taking: Reported on 08/12/2023)   No facility-administered encounter medications on file as of 08/12/2023.    Allergies as of 08/12/2023 - Review Complete  08/12/2023  Allergen Reaction Noted   Molds & smuts  10/05/2022   Mold extract [trichophyton] Other (See Comments) 04/06/2013    Past Medical History:  Diagnosis Date   ADHD (attention deficit hyperactivity disorder)    ADD - took medications in elementary school   Allergy    Anxiety    Arthritis    Asthma Dx1990   Depression    Diabetes (HCC)    GERD (gastroesophageal reflux disease)    occasional   Hypothyroid Dx 1994   Sleep apnea DX 2012   not using cpap, keeps head elevated   Sleep apnea    Sleep apnea    Sleep apnea    Vitamin D  insufficiency 10/26/2013    Past Surgical History:  Procedure Laterality Date   DENTAL SURGERY  2015   Took removal   DILATATION & CURETTAGE/HYSTEROSCOPY WITH MYOSURE N/A 08/04/2022   Procedure: DILATATION & CURETTAGE;  Surgeon: Meldon Sport, DO;  Location: [facility];  Service: Gynecology;  Laterality: N/A;   HYSTEROSCOPY  08/04/2022   Procedure: HYSTEROSCOPY;  Surgeon: Meldon Sport, DO;  Location: [facility];  Service: Gynecology;;    Family History  Problem Relation Age of Onset   Diabetes Mother    Hypertension Mother    Psoriasis Father    Healthy Sister    Hyperlipidemia Sister    Hypertension Sister    Healthy Brother    Mental illness Maternal Grandmother    Cancer Maternal Grandfather        Lung cancer   Mental illness Maternal Grandfather    Stroke Maternal Grandfather    Pneumonia Maternal Grandfather    Cancer Paternal Grandfather        colon and prostate    Social History   Socioeconomic History   Marital status: Single    Spouse name: Not on file   Number of children: 0   Years of education: 16   Highest education level: Not on file  Occupational History   Occupation: Water engineer  Tobacco Use   Smoking status: Never    Passive exposure: Past   Smokeless tobacco: Never  Vaping Use   Vaping status: Never Used  Substance and Sexual Activity   Alcohol use: Yes    Comment: rare   Drug use: No   Sexual  activity: Not Currently  Other Topics Concern   Not on file  Social History Narrative   She lives with parents.   She is currently unemployed and has been working on a manuscript (fiction).  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   Caffeine use: Drinks soda caffeine free, chocolate    Decaf coffee rarely    Drinks water mostly       Social Drivers of Corporate investment banker Strain: Not on file  Food Insecurity: Not on file  Transportation Needs: Not on file  Physical Activity: Not on file  Stress: Not on  file  Social Connections: Not on file  Intimate Partner Violence: Not on file    Review of Systems  HENT:  Positive for congestion.   Respiratory:  Positive for apnea, cough and shortness of breath.   Psychiatric/Behavioral:  Positive for sleep disturbance.     Vitals:   08/12/23 1415  BP: 108/72  Pulse: 86  SpO2: 96%     Physical Exam Constitutional:      Appearance: She is obese.  HENT:     Head: Normocephalic.     Mouth/Throat:     Mouth: Mucous membranes are moist.     Comments: Crowded oropharynx Eyes:     General: No scleral icterus. Cardiovascular:     Rate and Rhythm: Normal rate and regular rhythm.     Heart sounds: No murmur heard.    No friction rub.  Pulmonary:     Effort: No respiratory distress.     Breath sounds: No stridor. No wheezing or rhonchi.  Musculoskeletal:     Cervical back: No rigidity or tenderness.  Neurological:     Mental Status: She is alert.  Psychiatric:        Mood and Affect: Mood normal.    Data Reviewed: Echocardiogram June 2014 with diastolic dysfunction  Sleep study 12/27/2015 shows severe obstructive sleep apnea with AHI of 61 -Titration study recommended  Did request for PFT at the last visit, not performed yet  Echocardiogram 07/19/2023 is relatively normal with grade one diastolic dysfunction  Assessment:   Shortness of breath on exertion  Mild persistent asthma - She continues to use Advair and appears stable  Recent bronchitis  Severe obstructive sleep apnea - Does have an appointment for follow-[facility] at [facility] - Hopefully will be able to start get started on treatment  Grade 1 diastolic dysfunction  Class III obesity Hypothyroidism   Class III obesity  Hypothyroidism  History of major depression, generalized anxiety disorder  Shortness of breath is multifactorial Deconditioning  Plan/Recommendations:  Schedule for pulmonary function test  Graded activity as tolerated  If she can get started on treatment for severe obstructive sleep apnea soon as  Encouraged to continue Advair  Regular exercises encouraged  Follow-up in about 6 weeks  Call with significant concerns  Schedule for echocardiogram  Significant risk of harm if sleep apnea is not treated as she is noted to be very severe  Myer Artis MD [facility] 08/12/2023, 2:36 PM  CC: Sun, Vyvyan, MD

## 2023-08-12 NOTE — Patient Instructions (Signed)
 I will see you here in about 4 to 6 weeks  Continue your Advair  Continue albuterol  as needed  Make sure you keep an appointment with the sleep doctor at [facility]  Managing your sleep apnea is important  Graded exercises as tolerated  Call us  with significant concerns  We will get a breathing study at the next visit

## 2023-08-18 ENCOUNTER — Ambulatory Visit: Admitting: Physician Assistant

## 2023-08-30 ENCOUNTER — Ambulatory Visit (INDEPENDENT_AMBULATORY_CARE_PROVIDER_SITE_OTHER): Admitting: Gastroenterology

## 2023-08-30 ENCOUNTER — Telehealth: Payer: Self-pay

## 2023-08-30 ENCOUNTER — Encounter: Payer: Self-pay | Admitting: Gastroenterology

## 2023-08-30 VITALS — BP 106/68 | HR 94 | Ht 65.0 in | Wt 259.0 lb

## 2023-08-30 DIAGNOSIS — K625 Hemorrhage of anus and rectum: Secondary | ICD-10-CM

## 2023-08-30 DIAGNOSIS — R194 Change in bowel habit: Secondary | ICD-10-CM | POA: Diagnosis not present

## 2023-08-30 DIAGNOSIS — K649 Unspecified hemorrhoids: Secondary | ICD-10-CM

## 2023-08-30 DIAGNOSIS — R109 Unspecified abdominal pain: Secondary | ICD-10-CM

## 2023-08-30 DIAGNOSIS — K219 Gastro-esophageal reflux disease without esophagitis: Secondary | ICD-10-CM

## 2023-08-30 MED ORDER — HYDROCORTISONE ACETATE 25 MG RE SUPP: 25.0000 mg | Freq: Every evening | RECTAL | 0 refills | Status: AC

## 2023-08-30 MED ORDER — NA SULFATE-K SULFATE-MG SULF 17.5-3.13-1.6 GM/177ML PO SOLN: ORAL | 0 refills | Status: AC

## 2023-08-30 NOTE — Patient Instructions (Addendum)
 You have been scheduled for a colonoscopy. Please follow written instructions given to you at your visit today.   If you use inhalers (even only as needed), please bring them with you on the day of your procedure.  DO NOT TAKE 7 DAYS PRIOR TO TEST- Trulicity (dulaglutide) Ozempic, Wegovy (semaglutide) Mounjaro (tirzepatide) Bydureon Bcise (exanatide extended release)  DO NOT TAKE 1 DAY PRIOR TO YOUR TEST Rybelsus (semaglutide) Adlyxin (lixisenatide) Victoza (liraglutide) Byetta (exanatide) ___________________________________________________________________________  We have sent the following medications to your pharmacy for you to pick up at your convenience: Suprep, Anusol Suppositories  Start taking a daily fiber supplement   _______________________________________________________  If your blood pressure at your visit was 140/90 or greater, please contact your primary care physician to follow up on this.  _______________________________________________________  If you are age 52 or older, your body mass index should be between 23-30. Your Body mass index is 43.1 kg/m. If this is out of the aforementioned range listed, please consider follow up with your Primary Care Provider.  If you are age 52 or younger, your body mass index should be between 19-25. Your Body mass index is 43.1 kg/m. If this is out of the aformentioned range listed, please consider follow up with your Primary Care Provider.   ________________________________________________________  The [facility] providers would like to encourage you to use MYCHART to communicate with providers for non-urgent requests or questions.  Due to long hold times on the telephone, sending your provider a message by [facility] may be a faster and more efficient way to get a response.  Please allow 48 business hours for a response.  Please remember that this is for non-urgent requests.   _______________________________________________________  Due to recent changes in healthcare laws, you may see the results of your imaging and laboratory studies on MyChart before your provider has had a chance to review them.  We understand that in some cases there may be results that are confusing or concerning to you. Not all laboratory results come back in the same time frame and the provider may be waiting for multiple results in order to interpret others.  Please give us  48 hours in order for your provider to thoroughly review all the results before contacting the office for clarification of your results.   Thank you for entrusting me with your care and choosing [facility].  Valiant Gaul PA-C

## 2023-08-30 NOTE — Telephone Encounter (Signed)
  Kristi Ortiz [birth year] [id number]  08/30/23   Dear Dr Gaynell Keeler  :  We have scheduled the above named patient for a(n) colonoscopy procedure. Our records show that she is under your care for pulmonology.  Please advise as to whether the patient is medically cleared from a pulmonologist stand point   Please route your response to [facility] or fax response to [phone].  Sincerely,    [facility]

## 2023-08-30 NOTE — Telephone Encounter (Signed)
 Cleared for endoscopy from a pulmonary perspective  Moderate risk with surgery-risk of respiratory failure, risk of respiratory infection, hypoventilation, infection  Has sleep apnea which is not being treated currently There is some risk that patient may require perioperative respiratory support  May benefit from perioperative pulmonary toileting May require positive pressure ventilation perioperatively

## 2023-08-30 NOTE — Progress Notes (Signed)
 Kristi Ortiz [id number] [birth year]   Chief Complaint: Rectal bleeding, abdominal pain  Referring Provider: Sun, Vyvyan, MD Primary GI MD: Dr. Cherryl Corona  HPI: Kristi Ortiz is a 53 y.o. female with past medical history of asthma, GERD, diabetes, hypothyroidism, sleep apnea, vitamin D  deficiency, ADHD who presents today for follow-up of change in bowel habits, intermittent rectal bleeding, and lower abdominal cramping.    Patient initially seen in office 06/11/2023 by Reginal Capra, PA.  At that time patient reported alternating diarrhea and constipation, as well as 7 to 8 months of intermittent rectal bleeding with bowel movements.  She declined rectal exam at the time.  No prior colonoscopy.  Also reported occasional GERD.  Plan at last visit was to eventually have patient undergo EGD and colonoscopy for further evaluation, however she was being worked up for shortness of breath by pulmonology with an upcoming echo and pulmonary function test, and plan was to wait for this workup to be completed before scheduling procedures.  Dr. Milus Alpha recommendation was for colonoscopy, possibly without EGD based on mild occasional GERD symptoms.  Prior history/workup: - 10/05/2022 patient seen by [facility] at that time discussed 4-5 bowel movements per day with fecal urgency and some abdominal cramping as well as occasional bloody stools, stool studies previously negative.  At that time recommended colonoscopy and endoscopy.  Recommended to take fiber and IBgard also recommended to schedule colonoscopy. - 10/05/2022 celiac testing negative/normal. - 03/07/2023 CBC normal, CMP with a minimally elevated glucose and otherwise normal. - 06/10/2023 patient seen by pulmonology for shortness of breath.  Had recent treatment for bronchitis.  Also has adult onset asthma on Advair,  OSA diagnosed in 15, but not on CPAP.  She was scheduled for pulmonary function test and echocardiogram. - 08/12/2023 seen  by pulmonology for follow-up of shortness of breath, with symptoms reportedly about the same.  Recently had sleep study performed by [facility], noted to have severe OSA with desaturations.  Pulmonary function testing has been completed, results unavailable.  She is planning for follow-up with pulmonology 10/08/2023.   Patient here today with her boyfriend.  States she has been using her inhalers and finds these helpful for shortness of breath.  Over the last few days she has had a runny nose and cough, feels like she has a cold. Denies chest pain, fever, chills.  She continues to have intermittent rectal bleeding.  Last time she noticed blood was about a week ago.  States she will feel like she needs to have a bowel movement but just passes blood.  Unable to quantify amount but states it is not excessive.  She sees blood in the toilet, unsure if there is ever any blood in the stool.  She is having a bowel movement 3-4 times a day.  States that the consistency of her stool varies.  Can be loose or formed.  Sometimes she has a sensation of incomplete evacuation, may also have urgency.  She can sometimes have rectal soreness after a bowel movement.  Reports 1 episode of fecal incontinence a couple weeks ago.  She has intermittent lower abdominal pain which she says feels better now.    She tried taking a fiber supplement but was not very consistent with this.  States she may try this again.  She reports a sensation of throat burning after eating certain foods, particularly tomato-based products.  This occurs maybe once every 2 weeks.  She takes Tums as needed.  She reports that her paternal  grandfather either had primary colon or primary prostate cancer but she is unsure exactly what his diagnosis was.   Previous GI Procedures/Imaging   None  Past Medical History:  Diagnosis Date   ADHD (attention deficit hyperactivity disorder)    ADD - took medications in elementary school   Allergy    Anxiety     Arthritis    Asthma Dx1990   Depression    Diabetes (HCC)    GERD (gastroesophageal reflux disease)    occasional   Hypothyroid Dx 1994   Sleep apnea DX 2012   not using cpap, keeps head elevated   Sleep apnea    Sleep apnea    Sleep apnea    Vitamin D  insufficiency 10/26/2013    Past Surgical History:  Procedure Laterality Date   DENTAL SURGERY  2015   Took removal   DILATATION & CURETTAGE/HYSTEROSCOPY WITH MYOSURE N/A 08/04/2022   Procedure: DILATATION & CURETTAGE;  Surgeon: Meldon Sport, DO;  Location: [facility] OR;  Service: Gynecology;  Laterality: N/A;   HYSTEROSCOPY  08/04/2022   Procedure: HYSTEROSCOPY;  Surgeon: Meldon Sport, DO;  Location: [facility] OR;  Service: Gynecology;;    Current Outpatient Medications  Medication Sig Dispense Refill   acetaminophen  (TYLENOL ) 325 MG tablet Take 325 mg by mouth every 6 (six) hours as needed for mild pain (pain score 1-3), moderate pain (pain score 4-6), fever or headache.     albuterol  (VENTOLIN  HFA) 108 (90 Base) MCG/ACT inhaler Inhale 1-2 puffs into the lungs every 6 (six) hours as needed for wheezing or shortness of breath. (Patient taking differently: Inhale 1-2 puffs into the lungs every 6 (six) hours as needed for wheezing or shortness of breath (Cough).) 1 each 0   albuterol  (VENTOLIN  HFA) 108 (90 Base) MCG/ACT inhaler Inhale 2 puffs into the lungs every 4 (four) hours as needed for wheezing or shortness of breath. (Patient not taking: Reported on 08/12/2023)     amoxicillin -clavulanate (AUGMENTIN ) 875-125 MG tablet Take 1 tablet by mouth every 12 (twelve) hours. (Patient not taking: Reported on 08/12/2023) 14 tablet 0   Bismuth Subsalicylate (KAOPECTATE) 262 MG TABS Take 1-2 tablets by mouth daily as needed (Diarrhea). (Patient not taking: Reported on 08/12/2023)     cetirizine  (ZYRTEC ) 10 MG chewable tablet Chew 1 tablet (10 mg total) by mouth daily. 90 tablet 3   EUTHYROX  175 MCG tablet Take 175 mcg by mouth daily.      fluticasone -salmeterol (ADVAIR HFA) 115-21 MCG/ACT inhaler Inhale 2 puffs into the lungs 2 (two) times daily. 1 each 5   hydrocortisone 2.5 % cream Apply 1 Application topically daily as needed (itching).     ibuprofen  (ADVIL ) 200 MG tablet Take 200 mg by mouth every 6 (six) hours as needed.     ibuprofen  (ADVIL ) 800 MG tablet Take 1 tablet (800 mg total) by mouth every 8 (eight) hours as needed for cramping, moderate pain or mild pain. (Patient not taking: Reported on 08/12/2023) 30 tablet 0   lactobacillus acidophilus (BACID) TABS tablet Take 1 tablet by mouth at bedtime.     levothyroxine  (SYNTHROID ) 137 MCG tablet Take 137 mcg by mouth daily before breakfast. (Patient not taking: Reported on 08/12/2023)     Menthol 5.8 MG LOZG Use as directed in the mouth or throat.     metFORMIN  (GLUCOPHAGE -XR) 500 MG 24 hr tablet Take 500 mg by mouth daily.     montelukast (SINGULAIR) 10 MG tablet TAKE 1 TABLET BY MOUTH ONCE DAILY FOR 30 DAYS (Patient  not taking: Reported on 08/12/2023)     montelukast (SINGULAIR) 10 MG tablet Take 10 mg by mouth in the morning.     Multiple Vitamin (MULTIVITAMIN) tablet Take 1 tablet by mouth daily. Centrum woman. Immune Suppliment. (Patient not taking: Reported on 08/12/2023)     nystatin-triamcinolone  (MYCOLOG II) cream Apply 1 Application topically 2 (two) times daily as needed (Rash).     No current facility-administered medications for this visit.    Allergies as of 08/30/2023 - Review Complete 08/12/2023  Allergen Reaction Noted   Molds & smuts  10/05/2022   Mold extract [trichophyton] Other (See Comments) 04/06/2013    Family History  Problem Relation Age of Onset   Diabetes Mother    Hypertension Mother    Psoriasis Father    Healthy Sister    Hyperlipidemia Sister    Hypertension Sister    Healthy Brother    Mental illness Maternal Grandmother    Cancer Maternal Grandfather        Lung cancer   Mental illness Maternal Grandfather    Stroke Maternal  Grandfather    Pneumonia Maternal Grandfather    Cancer Paternal Grandfather        colon and prostate    Social History   Tobacco Use   Smoking status: Never    Passive exposure: Past   Smokeless tobacco: Never  Vaping Use   Vaping status: Never Used  Substance Use Topics   Alcohol use: Yes    Comment: rare   Drug use: No     Review of Systems:    Constitutional: No unexplained weight loss, fever, chills, weakness or fatigue Skin: No rash or itching Cardiovascular: No chest pain, chest pressure or palpitations   Respiratory: Positive shortness of breath, cough Gastrointestinal: See HPI and otherwise negative Genitourinary: No dysuria or change in urinary frequency Neurological: No headache, dizziness or syncope Musculoskeletal: No new muscle or joint pain Hematologic: No bruising    Physical Exam:  Vital signs: BP 106/68   Pulse 94   Ht 5' 5 (1.651 m)   Wt 259 lb (117.5 kg)   LMP 12/25/2015 (Approximate)   SpO2 100%   BMI 43.10 kg/m    Constitutional: NAD, Obese, alert and cooperative Head:  Normocephalic and atraumatic.  Eyes: No scleral icterus. Conjunctiva pink. Mouth: No oral lesions. Respiratory: Respirations even and unlabored. Lungs clear to auscultation bilaterally.  No wheezes, crackles, or rhonchi.  Cardiovascular:  Regular rate and rhythm. No murmurs. No peripheral edema. Gastrointestinal:  Soft, nondistended, nontender. No rebound or guarding. Normal bowel sounds. No appreciable masses or hepatomegaly. Rectal: Small, nonbleeding, nonthrombosed external hemorrhoid. No fissures. No stool in rectum. No obvious bleeding. Normal rectal tone. Chaperone present for exam.  Neurologic:  Alert and oriented x4;  grossly normal neurologically.  Skin:   Dry and intact without significant lesions or rashes. Psychiatric: Oriented to person, place and time. Demonstrates good judgement and reason without abnormal affect or behaviors.   RELEVANT LABS AND  IMAGING: CBC    Component Value Date/Time   WBC 6.2 03/07/2023 1610   RBC 4.48 03/07/2023 1610   HGB 12.1 03/07/2023 1610   HCT 38.9 03/07/2023 1610   PLT 323 03/07/2023 1610   MCV 86.8 03/07/2023 1610   MCV 82.4 10/23/2015 1053   MCH 27.0 03/07/2023 1610   MCHC 31.1 03/07/2023 1610   RDW 15.1 03/07/2023 1610   LYMPHSABS 1.7 03/07/2023 1610   MONOABS 0.8 03/07/2023 1610   EOSABS 0.4 03/07/2023 1610  BASOSABS 0.0 03/07/2023 1610    CMP     Component Value Date/Time   NA 140 03/07/2023 1610   K 3.7 03/07/2023 1610   CL 105 03/07/2023 1610   CO2 25 03/07/2023 1610   GLUCOSE 107 (H) 03/07/2023 1610   BUN 6 03/07/2023 1610   CREATININE 0.70 03/07/2023 1610   CREATININE 0.53 10/23/2015 1042   CALCIUM 8.9 03/07/2023 1610   PROT 6.1 10/23/2015 1042   ALBUMIN 3.8 10/23/2015 1042   AST 12 10/23/2015 1042   ALT 10 10/23/2015 1042   ALKPHOS 53 10/23/2015 1042   BILITOT 0.4 10/23/2015 1042   GFRNONAA >60 03/07/2023 1610   GFRNONAA >89 10/23/2015 1042   GFRAA >89 10/23/2015 1042   Echocardiogram 07/19/2023 1. Left ventricular ejection fraction, by estimation, is 65 to 70% . Left ventricular ejection fraction by 3D volume is 68 % . The left ventricle has normal function. The left ventricle has no regional wall motion abnormalities. Left ventricular diastolic parameters are consistent with Grade I diastolic dysfunction ( impaired relaxation) .  2. Right ventricular systolic function is normal. The right ventricular size is normal. There is normal pulmonary artery systolic pressure.  3. The mitral valve is normal in structure. No evidence of mitral valve regurgitation. No evidence of mitral stenosis.  4. The aortic valve is normal in structure. Aortic valve regurgitation is not visualized. No aortic stenosis is present.  5. The inferior vena cava is normal in size with greater than 50% respiratory variability, suggesting right atrial pressure of 3 mmHg.  Assessment/Plan:   Change in  bowel habits Rectal bleeding Abdominal pain Hemorrhoids  Patient continues to have intermittent lower abdominal pain which is improved, alternating loose and formed stools, as well as intermittent rectal bleeding.  Noted to have a small hemorrhoid on exam today.  Previously seen in clinic 05/2023, colonoscopy not scheduled at that time due to patient's shortness of breath and pending pulmonary workup. Echocardiogram reassuring with LVEF 65-70%, no aortic stenosis or pulmonary artery hypertension. Pulmonary function testing has been ordered. Patient has follow-up with pulmonology 10/08/2023. She does report an improvement in her shortness of breath recently on inhalers.  - Will tentatively schedule colonoscopy for August and request pulmonary clearance. I thoroughly discussed the procedure with the patient to include nature of the procedure, alternatives, benefits, and risks (including but not limited to bleeding, infection, perforation, anesthesia/cardiac/pulmonary complications). Patient verbalized understanding and gave verbal consent to proceed with procedure.  - Anusol suppositories, one nightly for 7-10 days for hemorrhoids - Start fiber supplement such as Benefiber or Metamucil  GERD Patient with intermittent acid reflux/throat burning after consuming trigger foods such as tomato-based products.  Symptoms occurring once every 2 weeks and relieved with Tums.  - Continue Tums as needed - Lifestyle modifications for GERD - If increasing frequency or worsening of symptoms consider starting PPI or H2RA  Valiant Gaul, PA-C [facility] 08/30/2023, 8:16 AM  Patient Care Team: Sun, Vyvyan, MD as PCP - General (Family Medicine)

## 2023-08-31 ENCOUNTER — Telehealth: Payer: Self-pay | Admitting: Gastroenterology

## 2023-08-31 NOTE — Telephone Encounter (Signed)
 Inbound call from patient stating insurance will not cover hydrocortisone suppositories. States medication needs prior authorization and a reason why medication is being prescribed. Please advise, thank you.

## 2023-09-01 ENCOUNTER — Other Ambulatory Visit (HOSPITAL_COMMUNITY): Payer: Self-pay

## 2023-09-01 ENCOUNTER — Telehealth: Payer: Self-pay

## 2023-09-01 NOTE — Telephone Encounter (Signed)
 Pharmacy Patient Advocate Encounter   Received notification from Pt Calls Messages that prior authorization for hydrocortisone (ANUSOL-HC) 25 MG suppository is required/requested.   Insurance verification completed.   The patient is insured through [facility] .   Per test claim: This is a plan exclusion and is not covered by patient insurance.

## 2023-09-01 NOTE — Telephone Encounter (Signed)
 Received pulmonary clearance via epic called patient no answer left detailed message on voice mail advising patient is cleared for endoscopy from a pulmonary perspective.

## 2023-09-06 ENCOUNTER — Other Ambulatory Visit: Payer: Self-pay

## 2023-09-06 MED ORDER — HYDROCORTISONE 2.5 % EX CREA: 1.0000 | TOPICAL_CREAM | Freq: Every day | CUTANEOUS | 0 refills | Status: AC | PRN

## 2023-09-06 MED ORDER — HYDROCORTISONE 2.5 % EX CREA: 1.0000 | TOPICAL_CREAM | Freq: Every day | CUTANEOUS | 0 refills | Status: DC | PRN

## 2023-09-06 NOTE — Progress Notes (Signed)
 Agree with the assessment and plan as outlined by Camie Furbish, PA-C.

## 2023-09-10 ENCOUNTER — Ambulatory Visit: Admitting: Pulmonary Disease

## 2023-09-10 DIAGNOSIS — R0602 Shortness of breath: Secondary | ICD-10-CM | POA: Diagnosis not present

## 2023-09-10 LAB — PULMONARY FUNCTION TEST
DL/VA % pred: 143 %
DL/VA: 6.12 ml/min/mmHg/L
DLCO unc % pred: 89 %
DLCO unc: 19.47 ml/min/mmHg
FEF 25-75 Post: 1.67 L/s
FEF 25-75 Pre: 1.58 L/s
FEF2575-%Change-Post: 5 %
FEF2575-%Pred-Post: 60 %
FEF2575-%Pred-Pre: 56 %
FEV1-%Change-Post: 4 %
FEV1-%Pred-Post: 54 %
FEV1-%Pred-Pre: 52 %
FEV1-Post: 1.57 L
FEV1-Pre: 1.5 L
FEV1FVC-%Change-Post: -8 %
FEV1FVC-%Pred-Pre: 102 %
FEV6-%Change-Post: 15 %
FEV6-%Pred-Post: 59 %
FEV6-%Pred-Pre: 51 %
FEV6-Post: 2.11 L
FEV6-Pre: 1.83 L
FEV6FVC-%Pred-Post: 102 %
FEV6FVC-%Pred-Pre: 102 %
FVC-%Change-Post: 15 %
FVC-%Pred-Post: 57 %
FVC-%Pred-Pre: 50 %
FVC-Post: 2.11 L
FVC-Pre: 1.83 L
Post FEV1/FVC ratio: 75 %
Post FEV6/FVC ratio: 100 %
Pre FEV1/FVC ratio: 82 %
Pre FEV6/FVC Ratio: 100 %
RV % pred: 129 %
RV: 2.41 L
TLC % pred: 88 %
TLC: 4.58 L

## 2023-09-10 NOTE — Patient Instructions (Signed)
 Full pft performed today.

## 2023-09-10 NOTE — Progress Notes (Signed)
 Full pft performed today.

## 2023-10-01 ENCOUNTER — Encounter: Payer: Self-pay | Admitting: Advanced Practice Midwife

## 2023-10-08 ENCOUNTER — Ambulatory Visit: Admitting: Pulmonary Disease

## 2023-11-09 ENCOUNTER — Encounter: Admitting: Gastroenterology

## 2024-01-11 ENCOUNTER — Encounter: Payer: Self-pay | Admitting: Pulmonary Disease

## 2024-01-11 ENCOUNTER — Ambulatory Visit (INDEPENDENT_AMBULATORY_CARE_PROVIDER_SITE_OTHER): Admitting: Pulmonary Disease

## 2024-01-11 VITALS — BP 115/76 | HR 91 | Ht 65.0 in | Wt 257.2 lb

## 2024-01-11 DIAGNOSIS — G4733 Obstructive sleep apnea (adult) (pediatric): Secondary | ICD-10-CM

## 2024-01-11 DIAGNOSIS — R0602 Shortness of breath: Secondary | ICD-10-CM | POA: Diagnosis not present

## 2024-01-11 MED ORDER — FLUTICASONE-SALMETEROL 115-21 MCG/ACT IN AERO: 2.0000 | INHALATION_SPRAY | Freq: Two times a day (BID) | RESPIRATORY_TRACT | 5 refills | Status: AC

## 2024-01-11 NOTE — Progress Notes (Signed)
 Kristi Ortiz    [id number]    28-Jun-1970  Primary Care Physician:Sun, Vyvyan, MD  Referring Physician: Sun, Vyvyan, MD [phone] [place],  [place] [address]  Chief complaint:   Patient seen for shortness of breath  HPI:  Breathing is a little bit better  Has been using her CPAP on a nightly basis She feels she is benefiting from using the CPAP nightly  Did review her PFT with her during the office visit today shows some air trapping, suggesting she will benefit from inhalers she does have a prescription for Advair which she has not been using Refills was placed today  Rarely uses albuterol   She feels she is doing well with the CPAP and using it regularly  She has no history of asthma growing up  Has a history of chronic hypoventilation  Past history of obstructive sleep apnea diagnosed in about 1997, had another study in 2013 CPAP is helping  Still does have snoring, occasional dryness of the mouth, occasional headaches Memory is fair Usually tries to go to bed about 10 PM sometimes take hours to fall asleep, time out of bed very significantly  She does have pet dogs  -Works as a conservation officer, nature, different establishments  Activity tolerance is improving   Outpatient Encounter Medications as of 01/11/2024  Medication Sig   acetaminophen  (TYLENOL ) 325 MG tablet Take 325 mg by mouth every 6 (six) hours as needed for mild pain (pain score 1-3), moderate pain (pain score 4-6), fever or headache.   albuterol  (VENTOLIN  HFA) 108 (90 Base) MCG/ACT inhaler Inhale 1-2 puffs into the lungs every 6 (six) hours as needed for wheezing or shortness of breath.   albuterol  (VENTOLIN  HFA) 108 (90 Base) MCG/ACT inhaler Inhale 2 puffs into the lungs every 4 (four) hours as needed for wheezing or shortness of breath.   cetirizine  (ZYRTEC ) 10 MG chewable tablet Chew 1 tablet (10 mg total) by mouth daily.   ibuprofen  (ADVIL ) 200 MG tablet Take 200 mg by mouth every 6  (six) hours as needed.   ibuprofen  (ADVIL ) 800 MG tablet Take 1 tablet (800 mg total) by mouth every 8 (eight) hours as needed for cramping, moderate pain or mild pain.   lactobacillus acidophilus (BACID) TABS tablet Take 1 tablet by mouth at bedtime.   levothyroxine  (SYNTHROID ) 137 MCG tablet Take 137 mcg by mouth daily before breakfast.   Menthol 5.8 MG LOZG Use as directed in the mouth or throat.   metFORMIN  (GLUCOPHAGE -XR) 500 MG 24 hr tablet Take 500 mg by mouth daily.   montelukast (SINGULAIR) 10 MG tablet TAKE 1 TABLET BY MOUTH ONCE DAILY FOR 30 DAYS   montelukast (SINGULAIR) 10 MG tablet Take 10 mg by mouth in the morning.   Multiple Vitamin (MULTIVITAMIN) tablet Take 1 tablet by mouth daily. Centrum woman. Immune Suppliment.   Na Sulfate-K Sulfate-Mg Sulfate concentrate (SUPREP) 17.5-3.13-1.6 GM/177ML SOLN Use as directed; may use generic; goodrx card if insurance will not cover generic   nystatin-triamcinolone  (MYCOLOG II) cream Apply 1 Application topically 2 (two) times daily as needed (Rash).   [DISCONTINUED] fluticasone -salmeterol (ADVAIR HFA) 115-21 MCG/ACT inhaler Inhale 2 puffs into the lungs 2 (two) times daily.   fluticasone -salmeterol (ADVAIR HFA) 115-21 MCG/ACT inhaler Inhale 2 puffs into the lungs 2 (two) times daily.   No facility-administered encounter medications on file as of 01/11/2024.    Allergies as of 01/11/2024 - Review Complete 01/11/2024  Allergen Reaction Noted  Molds & smuts  10/05/2022   Mold extract [trichophyton] Other (See Comments) 04/06/2013    Past Medical History:  Diagnosis Date   ADHD (attention deficit hyperactivity disorder)    ADD - took medications in elementary school   Allergy    Anxiety    Arthritis    Asthma Dx1990   Depression    Diabetes (HCC)    GERD (gastroesophageal reflux disease)    occasional   Hypothyroid Dx 1994   Sleep apnea DX 2012   not using cpap, keeps head elevated   Sleep apnea    Sleep apnea    Sleep  apnea    Vitamin D  insufficiency 10/26/2013    Past Surgical History:  Procedure Laterality Date   DENTAL SURGERY  2015   Took removal   DILATATION & CURETTAGE/HYSTEROSCOPY WITH MYOSURE N/A 08/04/2022   Procedure: DILATATION & CURETTAGE;  Surgeon: Storm Setter, DO;  Location: [facility] OR;  Service: Gynecology;  Laterality: N/A;   HYSTEROSCOPY  08/04/2022   Procedure: HYSTEROSCOPY;  Surgeon: Storm Setter, DO;  Location: [facility] OR;  Service: Gynecology;;    Family History  Problem Relation Age of Onset   Diabetes Mother    Hypertension Mother    Psoriasis Father    Healthy Sister    Hyperlipidemia Sister    Hypertension Sister    Healthy Brother    Mental illness Maternal Grandmother    Cancer Maternal Grandfather        Lung cancer   Mental illness Maternal Grandfather    Stroke Maternal Grandfather    Pneumonia Maternal Grandfather    Cancer Paternal Grandfather        colon and prostate    Social History   Socioeconomic History   Marital status: Single    Spouse name: Not on file   Number of children: 0   Years of education: 16   Highest education level: Not on file  Occupational History   Occupation: Water engineer  Tobacco Use   Smoking status: Never    Passive exposure: Past   Smokeless tobacco: Never  Vaping Use   Vaping status: Never Used  Substance and Sexual Activity   Alcohol use: Yes    Comment: rare   Drug use: No   Sexual activity: Not Currently  Other Topics Concern   Not on file  Social History Narrative   She lives with parents.   She is currently unemployed and has been working on a manuscript (fiction).  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   She is going to [employer] in childhood education.   Caffeine use: Drinks soda caffeine free, chocolate    Decaf coffee rarely    Drinks water mostly       Social Drivers of Corporate Investment Banker Strain: Not on file  Food Insecurity: Not on file   Transportation Needs: Not on file  Physical Activity: Not on file  Stress: Not on file  Social Connections: Not on file  Intimate Partner Violence: Not on file    Review of Systems  HENT:  Positive for congestion.   Respiratory:  Positive for apnea, cough and shortness of breath.   Psychiatric/Behavioral:  Positive for sleep disturbance.     Vitals:   01/11/24 1512  BP: 115/76  Pulse: 91  SpO2: 96%     Physical Exam Constitutional:      Appearance: She is obese.  HENT:     Head: Normocephalic.  Mouth/Throat:     Mouth: Mucous membranes are moist.     Comments: Crowded oropharynx Eyes:     General: No scleral icterus. Cardiovascular:     Rate and Rhythm: Normal rate and regular rhythm.     Heart sounds: No murmur heard.    No friction rub.  Pulmonary:     Effort: No respiratory distress.     Breath sounds: No stridor. No wheezing or rhonchi.  Musculoskeletal:     Cervical back: No rigidity or tenderness.  Neurological:     Mental Status: She is alert.  Psychiatric:        Mood and Affect: Mood normal.    Data Reviewed: Echocardiogram June 2014 with diastolic dysfunction  Sleep study 12/27/2015 shows severe obstructive sleep apnea with AHI of 61 -Titration study recommended  PFT reviewed with patient showing no significant obstruction, does show elevated RV/TLC  Echocardiogram 07/19/2023 is relatively normal with grade one diastolic dysfunction  Assessment:  Shortness of breath - This is better  Mild persistent asthma - Refills for Advair placed  Severe obstructive sleep apnea - Using CPAP Benefiting from CPAP use - Encouraged to continue using CPAP nightly  Diastolic dysfunction - Class III obesity  Hypothyroidism  History of major depression, generalized anxiety disorder  Multifactorial shortness of breath  Plan/Recommendations:  Encouraged to continue using CPAP on a nightly basis  Graded activities as tolerated  Encouraged to  use Advair on a regular basis  Regular exercises encouraged  Call with significant concerns  I spent 32 minutes Pre-charting, reviewing records, interviewing/examining patient, and managing orders.   Jennet Epley MD [facility] 01/11/2024, 4:03 PM  CC: Sun, Vyvyan, MD

## 2024-01-11 NOTE — Patient Instructions (Addendum)
 I will see you back in 3 months  Make sure you are using your CPAP every night  Your breathing study suggest that you will benefit from using inhalers  Refills for your Advair sent into pharmacy  Use albuterol  as needed  Graded exercise as tolerated

## 2024-03-22 NOTE — Progress Notes (Signed)
 "  Chief Complaint: Follow-up Primary GI MD: Dr. Stacia  HPI: 54 y.o. female with past medical history of asthma, GERD, diabetes, hypothyroidism, sleep apnea, vitamin D  deficiency, ADHD presents for follow-up   Prior history/workup: - 10/05/2022 patient seen by [facility] at that time discussed 4-5 bowel movements per day with fecal urgency and some abdominal cramping as well as occasional bloody stools, stool studies previously negative.  At that time recommended colonoscopy and endoscopy.  Recommended to take fiber and IBgard also recommended to schedule colonoscopy. - 10/05/2022 celiac testing negative/normal. - 03/07/2023 CBC normal, CMP with a minimally elevated glucose and otherwise normal. - 06/10/2023 patient seen by pulmonology for shortness of breath.  Had recent treatment for bronchitis.  Also has adult onset asthma on Advair,  OSA diagnosed in 39, but not on CPAP.  She was scheduled for pulmonary function test and echocardiogram. - 08/12/2023 seen by pulmonology for follow-up of shortness of breath, with symptoms reportedly about the same.  Recently had sleep study performed by [facility], noted to have severe OSA with desaturations.  Pulmonary function testing has been completed, results unavailable.  She is planning for follow-up with pulmonology 10/08/2023.     06/11/2023 seen by Kristi Failing, PA for alternating diarrhea and constipation as well as GERD but due to shortness of breath and concurrent pulmonology workup endoscopic evaluation was deferred until workup was complete    08/30/2023 seen by Kristi Furbish, PA-C for alternating bowel habits and intermittent rectal bleeding.  Colonoscopy was tentatively scheduled for August.  Per pulmonology (Dr. Jennet) Cleared for endoscopy from a pulmonary perspective. Moderate risk with surgery-risk of respiratory failure, risk of respiratory infection, hypoventilation, infection. Has sleep apnea which is not being treated currently There is  some risk that patient may require perioperative respiratory support. May benefit from perioperative pulmonary toileting. May require positive pressure ventilation perioperatively  But appears colonoscopy was never pursued  -------------TODAY------------------  Discussed the use of AI scribe software for clinical note transcription with the patient, who gave verbal consent to proceed.  History of Present Illness   Since her last evaluation in June, bowel movements have remained variable, with both loose and formed stools occurring in a fluctuating pattern. The proportion of loose to formed stools changes frequently, sometimes both types occurring in a single day. She does not take fiber supplements regularly, though Metamucil has been used intermittently. Daily coffee intake and increased water consumption to at least 64 ounces per day are reported.  Rectal bleeding was previously observed on tissue paper and in the toilet, but no further episodes have occurred recently. Use of a topical cream in the past was felt to resolve the bleeding.  She has never undergone colonoscopy and expresses interest in colorectal cancer screening.   PREVIOUS GI WORKUP   Echocardiogram 07/19/2023 1. Left ventricular ejection fraction, by estimation, is 65 to 70% . Left ventricular ejection fraction by 3D volume is 68 % . The left ventricle has normal function. The left ventricle has no regional wall motion abnormalities. Left ventricular diastolic parameters are consistent with Grade I diastolic dysfunction ( impaired relaxation) .  2. Right ventricular systolic function is normal. The right ventricular size is normal. There is normal pulmonary artery systolic pressure.  3. The mitral valve is normal in structure. No evidence of mitral valve regurgitation. No evidence of mitral stenosis.  4. The aortic valve is normal in structure. Aortic valve regurgitation is not visualized. No aortic stenosis is present.  5.  The  inferior vena cava is normal in size with greater than 50% respiratory variability, suggesting right atrial pressure of 3 mmHg.  Past Medical History:  Diagnosis Date   ADHD (attention deficit hyperactivity disorder)    ADD - took medications in elementary school   Allergy    Anxiety    Arthritis    Asthma Dx1990   Depression    Diabetes (HCC)    GERD (gastroesophageal reflux disease)    occasional   Hypothyroid Dx 1994   Sleep apnea DX 2012   not using cpap, keeps head elevated   Sleep apnea    Sleep apnea    Sleep apnea    Vitamin D  insufficiency 10/26/2013    Past Surgical History:  Procedure Laterality Date   DENTAL SURGERY  2015   Took removal   DILATATION & CURETTAGE/HYSTEROSCOPY WITH MYOSURE N/A 08/04/2022   Procedure: DILATATION & CURETTAGE;  Surgeon: Kristi Setter, DO;  Location: [facility] OR;  Service: Gynecology;  Laterality: N/A;   HYSTEROSCOPY  08/04/2022   Procedure: HYSTEROSCOPY;  Surgeon: Kristi Setter, DO;  Location: [facility] OR;  Service: Gynecology;;    Current Outpatient Medications  Medication Sig Dispense Refill   ACCU-CHEK GUIDE TEST test strip 1 each by Other route as needed for other.     Accu-Chek Softclix Lancets lancets 100 each by Other route daily.     acetaminophen  (TYLENOL ) 325 MG tablet Take 325 mg by mouth every 6 (six) hours as needed for mild pain (pain score 1-3), moderate pain (pain score 4-6), fever or headache.     albuterol  (VENTOLIN  HFA) 108 (90 Base) MCG/ACT inhaler Inhale 2 puffs into the lungs every 4 (four) hours as needed for wheezing or shortness of breath.     ARIPiprazole (ABILIFY) 2 MG tablet Take 2 mg by mouth daily.     Blood Glucose Monitoring Suppl (ACCU-CHEK GUIDE) w/Device KIT 1 each by Other route See admin instructions.     cetirizine  (ZYRTEC ) 10 MG chewable tablet Chew 1 tablet (10 mg total) by mouth daily. 90 tablet 3   fluticasone -salmeterol (ADVAIR HFA) 115-21 MCG/ACT inhaler Inhale 2 puffs into the lungs 2 (two) times  daily. 1 each 5   hydrocortisone  cream 0.5 % Apply 1 Application topically as needed for itching.     ibuprofen  (ADVIL ) 800 MG tablet Take 1 tablet (800 mg total) by mouth every 8 (eight) hours as needed for cramping, moderate pain or mild pain. 30 tablet 0   lactobacillus acidophilus (BACID) TABS tablet Take 1 tablet by mouth at bedtime.     levothyroxine  (SYNTHROID ) 137 MCG tablet Take 137 mcg by mouth daily before breakfast.     Menthol 5.8 MG LOZG Use as directed in the mouth or throat.     metFORMIN  (GLUCOPHAGE -XR) 500 MG 24 hr tablet Take 500 mg by mouth daily.     montelukast (SINGULAIR) 10 MG tablet Take 10 mg by mouth in the morning.     Multiple Vitamin (MULTIVITAMIN) tablet Take 1 tablet by mouth daily. Centrum woman. Immune Suppliment.     Na Sulfate-K Sulfate-Mg Sulfate concentrate (SUPREP) 17.5-3.13-1.6 GM/177ML SOLN Use as directed; may use generic; goodrx card if insurance will not cover generic 354 mL 0   nystatin-triamcinolone  (MYCOLOG II) cream Apply 1 Application topically 2 (two) times daily as needed (Rash).     sertraline (ZOLOFT) 25 MG tablet Take 25 mg by mouth daily.     No current facility-administered medications for this visit.    Allergies as of 03/23/2024 -  Review Complete 03/23/2024  Allergen Reaction Noted   Molds & smuts  10/05/2022   Mold extract [trichophyton] Other (See Comments) 04/06/2013    Family History  Problem Relation Age of Onset   Diabetes Mother    Hypertension Mother    Psoriasis Father    Healthy Sister    Hyperlipidemia Sister    Hypertension Sister    Healthy Brother    Mental illness Maternal Grandmother    Cancer Maternal Grandfather        Lung cancer   Mental illness Maternal Grandfather    Stroke Maternal Grandfather    Pneumonia Maternal Grandfather    Cancer Paternal Grandfather        colon and prostate    Social History   Socioeconomic History   Marital status: Single    Spouse name: Not on file   Number of  children: 0   Years of education: 16   Highest education level: Not on file  Occupational History   Occupation: Water engineer  Tobacco Use   Smoking status: Never    Passive exposure: Past   Smokeless tobacco: Never  Vaping Use   Vaping status: Never Used  Substance and Sexual Activity   Alcohol use: Yes    Comment: rare   Drug use: No   Sexual activity: Not Currently  Other Topics Concern   Not on file  Social History Narrative   She lives with parents.   She is currently unemployed and has been working on a manuscript (fiction).  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   She is going to [employer] in childhood education.   Caffeine use: Drinks soda caffeine free, chocolate    Decaf coffee rarely    Drinks water mostly       Social Drivers of Health   Tobacco Use: Low Risk (03/23/2024)   Patient History    Smoking Tobacco Use: Never    Smokeless Tobacco Use: Never    Passive Exposure: Past  Financial Resource Strain: Not on file  Food Insecurity: Not on file  Transportation Needs: Not on file  Physical Activity: Not on file  Stress: Not on file  Social Connections: Not on file  Intimate Partner Violence: Not on file  Depression (PHQ2-9): Not on file  Alcohol Screen: Not on file  Housing: Not on file  Utilities: Not on file  Health Literacy: Not on file    Review of Systems:    Constitutional: No weight loss, fever, chills, weakness or fatigue HEENT: Eyes: No change in vision               Ears, Nose, Throat:  No change in hearing or congestion Skin: No rash or itching Cardiovascular: No chest pain, chest pressure or palpitations   Respiratory: No SOB or cough Gastrointestinal: See HPI and otherwise negative Genitourinary: No dysuria or change in urinary frequency Neurological: No headache, dizziness or syncope Musculoskeletal: No new muscle or joint pain Hematologic: No bleeding or bruising Psychiatric: No history of  depression or anxiety    Physical Exam:  Vital signs: BP 104/70 (BP Location: Left Arm, Patient Position: Sitting, Cuff Size: Normal)   Pulse 84   Ht 5' 5 (1.651 m)   Wt 267 lb (121.1 kg)   LMP 12/25/2015   BMI 44.43 kg/m   Constitutional: NAD, alert and cooperative Head:  Normocephalic and atraumatic. Eyes:   PEERL, EOMI. No icterus. Conjunctiva pink. Respiratory: Respirations even and unlabored.  Lungs clear to auscultation bilaterally.   No wheezes, crackles, or rhonchi.  Cardiovascular:  Regular rate and rhythm. No peripheral edema, cyanosis or pallor.  Gastrointestinal:  Soft, protuberant abdomen,, nondistended, nontender. No rebound or guarding. Normal bowel sounds. No appreciable masses or hepatomegaly. Rectal:  Declines Msk:  Symmetrical without gross deformities. Without edema, no deformity or joint abnormality.  Neurologic:  Alert and  oriented x4;  grossly normal neurologically.  Skin:   Dry and intact without significant lesions or rashes. Psychiatric: Oriented to person, place and time. Demonstrates good judgement and reason without abnormal affect or behaviors.  Physical Exam    RELEVANT LABS AND IMAGING: CBC    Component Value Date/Time   WBC 6.2 03/07/2023 1610   RBC 4.48 03/07/2023 1610   HGB 12.1 03/07/2023 1610   HCT 38.9 03/07/2023 1610   PLT 323 03/07/2023 1610   MCV 86.8 03/07/2023 1610   MCV 82.4 10/23/2015 1053   MCH 27.0 03/07/2023 1610   MCHC 31.1 03/07/2023 1610   RDW 15.1 03/07/2023 1610   LYMPHSABS 1.7 03/07/2023 1610   MONOABS 0.8 03/07/2023 1610   EOSABS 0.4 03/07/2023 1610   BASOSABS 0.0 03/07/2023 1610    CMP     Component Value Date/Time   NA 140 03/07/2023 1610   K 3.7 03/07/2023 1610   CL 105 03/07/2023 1610   CO2 25 03/07/2023 1610   GLUCOSE 107 (H) 03/07/2023 1610   BUN 6 03/07/2023 1610   CREATININE 0.70 03/07/2023 1610   CREATININE 0.53 10/23/2015 1042   CALCIUM 8.9 03/07/2023 1610   PROT 6.1 10/23/2015 1042    ALBUMIN 3.8 10/23/2015 1042   AST 12 10/23/2015 1042   ALT 10 10/23/2015 1042   ALKPHOS 53 10/23/2015 1042   BILITOT 0.4 10/23/2015 1042   GFRNONAA >60 03/07/2023 1610   GFRNONAA >89 10/23/2015 1042   GFRAA >89 10/23/2015 1042     Assessment/Plan:   Alternating bowel habits Rectal bleeding Abdominal pain Hemorrhoids Pulmonology provided clearance 08/2023 and reported she has moderate risk.  Appears this procedure was never completed.  Rectal exam at last visit showed nonthrombosed external hemorrhoid.  Previous treatment with Anusol  suppositories and recommended fiber supplement.  Resolution of rectal bleeding s/p cream.  Continued altered bowel habits with inconsistent fiber supplement - Colonoscopy - Recommend fiber supplement on a daily basis, stressed importance of continuity and consistency - If rectal bleeding recurs please let us  know - I thoroughly discussed the procedure with the patient (at bedside) to include nature of the procedure, alternatives, benefits, and risks (including but not limited to bleeding, infection, perforation, anesthesia/cardiac pulmonary complications).  Patient verbalized understanding and gave verbal consent to proceed with procedure.   GERD Intermittent burning after trigger foods, using Tums as needed  Shortness of breath S/p extensive workup with pulmonology with recent clearance provided  Sleep apnea Not currently on CPAP  Kristi Ortiz Mollie RIGGERS [facility] 03/23/2024, 10:08 AM  Cc: Sun, Vyvyan, MD "

## 2024-03-23 ENCOUNTER — Encounter: Payer: Self-pay | Admitting: Gastroenterology

## 2024-03-23 ENCOUNTER — Ambulatory Visit (INDEPENDENT_AMBULATORY_CARE_PROVIDER_SITE_OTHER): Admitting: Gastroenterology

## 2024-03-23 VITALS — BP 104/70 | HR 84 | Ht 65.0 in | Wt 267.0 lb

## 2024-03-23 DIAGNOSIS — R109 Unspecified abdominal pain: Secondary | ICD-10-CM

## 2024-03-23 DIAGNOSIS — K625 Hemorrhage of anus and rectum: Secondary | ICD-10-CM | POA: Diagnosis not present

## 2024-03-23 DIAGNOSIS — K644 Residual hemorrhoidal skin tags: Secondary | ICD-10-CM | POA: Diagnosis not present

## 2024-03-23 DIAGNOSIS — K219 Gastro-esophageal reflux disease without esophagitis: Secondary | ICD-10-CM

## 2024-03-23 DIAGNOSIS — Z1211 Encounter for screening for malignant neoplasm of colon: Secondary | ICD-10-CM

## 2024-03-23 DIAGNOSIS — R194 Change in bowel habit: Secondary | ICD-10-CM | POA: Diagnosis not present

## 2024-03-23 NOTE — Patient Instructions (Addendum)
 A high fiber diet with plenty of fluids (up to 8 glasses of water daily) is suggested to relieve these symptoms.  Benefiber, 1 tablespoon once or twice daily can be used to keep bowels regular if needed.  You have been scheduled for a colonoscopy. Please follow written instructions given to you at your visit today.   If you use inhalers (even only as needed), please bring them with you on the day of your procedure.  DO NOT TAKE 7 DAYS PRIOR TO TEST- Trulicity (dulaglutide) Ozempic, Wegovy (semaglutide) Mounjaro, Zepbound (tirzepatide) Bydureon Bcise (exanatide extended release)  DO NOT TAKE 1 DAY PRIOR TO YOUR TEST Rybelsus (semaglutide) Adlyxin (lixisenatide) Victoza (liraglutide) Byetta (exanatide) ___________________________________________________________________________  _______________________________________________________  If your blood pressure at your visit was 140/90 or greater, please contact your primary care physician to follow up on this.  _______________________________________________________  If you are age 53 or older, your body mass index should be between 23-30. Your Body mass index is 44.43 kg/m. If this is out of the aforementioned range listed, please consider follow up with your Primary Care Provider.  If you are age 29 or younger, your body mass index should be between 19-25. Your Body mass index is 44.43 kg/m. If this is out of the aformentioned range listed, please consider follow up with your Primary Care Provider.   ________________________________________________________  The [facility] providers would like to encourage you to use MYCHART to communicate with providers for non-urgent requests or questions.  Due to long hold times on the telephone, sending your provider a message by [facility] may be a faster and more efficient way to get a response.  Please allow 48 business hours for a response.  Please remember that this is for non-urgent requests.   _______________________________________________________  [facility] is using a team-based approach to care.  Your team is made up of your doctor and two to three APPS. Our APPS (Nurse Practitioners and Physician Assistants) work with your physician to ensure care continuity for you. They are fully qualified to address your health concerns and develop a treatment plan. They communicate directly with your gastroenterologist to care for you. Seeing the Advanced Practice Practitioners on your physician's team can help you by facilitating care more promptly, often allowing for earlier appointments, access to diagnostic testing, procedures, and other specialty referrals.

## 2024-04-02 NOTE — Progress Notes (Signed)
 Agree with the assessment and plan as outlined by Boone Master, PA-C.

## 2024-04-03 ENCOUNTER — Ambulatory Visit: Admitting: Pulmonary Disease

## 2024-04-03 ENCOUNTER — Encounter: Payer: Self-pay | Admitting: Pulmonary Disease

## 2024-04-03 VITALS — BP 112/72 | HR 87 | Ht 65.0 in | Wt 217.0 lb

## 2024-04-03 DIAGNOSIS — R0602 Shortness of breath: Secondary | ICD-10-CM

## 2024-04-03 DIAGNOSIS — F411 Generalized anxiety disorder: Secondary | ICD-10-CM | POA: Diagnosis not present

## 2024-04-03 DIAGNOSIS — E66813 Obesity, class 3: Secondary | ICD-10-CM

## 2024-04-03 DIAGNOSIS — G4733 Obstructive sleep apnea (adult) (pediatric): Secondary | ICD-10-CM | POA: Diagnosis not present

## 2024-04-03 DIAGNOSIS — I5189 Other ill-defined heart diseases: Secondary | ICD-10-CM

## 2024-04-03 DIAGNOSIS — F329 Major depressive disorder, single episode, unspecified: Secondary | ICD-10-CM | POA: Diagnosis not present

## 2024-04-03 DIAGNOSIS — E039 Hypothyroidism, unspecified: Secondary | ICD-10-CM | POA: Diagnosis not present

## 2024-04-03 DIAGNOSIS — R0600 Dyspnea, unspecified: Secondary | ICD-10-CM

## 2024-04-03 NOTE — Progress Notes (Signed)
 "              Kristi Ortiz    [id number]    [birth year]  Primary Care Physician:Sun, Vyvyan, MD  Referring Physician: Sun, Vyvyan, MD [phone] [place],  [place] [address]  Chief complaint:   Follow-up for obstructive sleep apnea  HPI:  Breathing feels about the same  Having some issues with CPAP mask Feels sometimes that the mask is not sealing well  She is thinking about ordering a new mask that covers the nose not one that sits below the nasal passages  She is trying to use the CPAP nightly She feels when she is able to keep it on she benefits from CPAP use  Breathing feels about the same PFT shows evidence of air trapping, uses inhalers  No history of asthma growing up  History of chronic hypoventilation  History of obstructive sleep apnea diagnosed in 1997, another study in 2013 CPAP seems to be helping  She does have pet dogs  Activity tolerance about the same  Outpatient Encounter Medications as of 04/03/2024  Medication Sig   ACCU-CHEK GUIDE TEST test strip 1 each by Other route as needed for other.   Accu-Chek Softclix Lancets lancets 100 each by Other route daily.   albuterol  (VENTOLIN  HFA) 108 (90 Base) MCG/ACT inhaler Inhale 2 puffs into the lungs every 4 (four) hours as needed for wheezing or shortness of breath.   ARIPiprazole (ABILIFY) 2 MG tablet Take 2 mg by mouth daily.   Blood Glucose Monitoring Suppl (ACCU-CHEK GUIDE) w/Device KIT 1 each by Other route See admin instructions.   cetirizine  (ZYRTEC ) 10 MG chewable tablet Chew 1 tablet (10 mg total) by mouth daily.   fluticasone -salmeterol (ADVAIR HFA) 115-21 MCG/ACT inhaler Inhale 2 puffs into the lungs 2 (two) times daily.   hydrocortisone  cream 0.5 % Apply 1 Application topically as needed for itching.   ibuprofen  (ADVIL ) 800 MG tablet Take 1 tablet (800 mg total) by mouth every 8 (eight) hours as needed for cramping, moderate pain or mild pain.   lactobacillus acidophilus (BACID)  TABS tablet Take 1 tablet by mouth at bedtime.   levothyroxine  (SYNTHROID ) 137 MCG tablet Take 137 mcg by mouth daily before breakfast.   Menthol 5.8 MG LOZG Use as directed in the mouth or throat.   metFORMIN  (GLUCOPHAGE -XR) 500 MG 24 hr tablet Take 500 mg by mouth daily.   montelukast (SINGULAIR) 10 MG tablet Take 10 mg by mouth in the morning.   Na Sulfate-K Sulfate-Mg Sulfate concentrate (SUPREP) 17.5-3.13-1.6 GM/177ML SOLN Use as directed; may use generic; goodrx card if insurance will not cover generic   sertraline (ZOLOFT) 25 MG tablet Take 25 mg by mouth daily.   acetaminophen  (TYLENOL ) 325 MG tablet Take 325 mg by mouth every 6 (six) hours as needed for mild pain (pain score 1-3), moderate pain (pain score 4-6), fever or headache. (Patient not taking: Reported on 04/03/2024)   Multiple Vitamin (MULTIVITAMIN) tablet Take 1 tablet by mouth daily. Centrum woman. Immune Suppliment. (Patient not taking: Reported on 04/03/2024)   nystatin-triamcinolone  (MYCOLOG II) cream Apply 1 Application topically 2 (two) times daily as needed (Rash). (Patient not taking: Reported on 04/03/2024)   No facility-administered encounter medications on file as of 04/03/2024.    Allergies as of 04/03/2024 - Review Complete 04/03/2024  Allergen Reaction Noted   Molds & smuts  10/05/2022   Mold extract [trichophyton] Other (See Comments) 04/06/2013    Past Medical History:  Diagnosis  Date   ADHD (attention deficit hyperactivity disorder)    ADD - took medications in elementary school   Allergy    Anxiety    Arthritis    Asthma Dx1990   Depression    Diabetes (HCC)    GERD (gastroesophageal reflux disease)    occasional   Hypothyroid Dx 1994   Sleep apnea DX 2012   not using cpap, keeps head elevated   Sleep apnea    Sleep apnea    Sleep apnea    Vitamin D  insufficiency 10/26/2013    Past Surgical History:  Procedure Laterality Date   DENTAL SURGERY  2015   Took removal   DILATATION &  CURETTAGE/HYSTEROSCOPY WITH MYOSURE N/A 08/04/2022   Procedure: DILATATION & CURETTAGE;  Surgeon: Storm Setter, DO;  Location: [facility] OR;  Service: Gynecology;  Laterality: N/A;   HYSTEROSCOPY  08/04/2022   Procedure: HYSTEROSCOPY;  Surgeon: Storm Setter, DO;  Location: [facility] OR;  Service: Gynecology;;    Family History  Problem Relation Age of Onset   Diabetes Mother    Hypertension Mother    Psoriasis Father    Healthy Sister    Hyperlipidemia Sister    Hypertension Sister    Healthy Brother    Mental illness Maternal Grandmother    Cancer Maternal Grandfather        Lung cancer   Mental illness Maternal Grandfather    Stroke Maternal Grandfather    Pneumonia Maternal Grandfather    Cancer Paternal Grandfather        colon and prostate    Social History   Socioeconomic History   Marital status: Single    Spouse name: Not on file   Number of children: 0   Years of education: 16   Highest education level: Not on file  Occupational History   Occupation: Water engineer  Tobacco Use   Smoking status: Never    Passive exposure: Past   Smokeless tobacco: Never  Vaping Use   Vaping status: Never Used  Substance and Sexual Activity   Alcohol use: Yes    Comment: rare   Drug use: No   Sexual activity: Not Currently  Other Topics Concern   Not on file  Social History Narrative   She lives with parents.   She is currently unemployed and has been working on a manuscript (fiction).  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   She is going to [employer] in childhood education.   Caffeine use: Drinks soda caffeine free, chocolate    Decaf coffee rarely    Drinks water mostly       Social Drivers of Health   Tobacco Use: Low Risk (04/03/2024)   Patient History    Smoking Tobacco Use: Never    Smokeless Tobacco Use: Never    Passive Exposure: Past  Financial Resource Strain: Not on file  Food Insecurity: Not on file  Transportation Needs:  Not on file  Physical Activity: Not on file  Stress: Not on file  Social Connections: Not on file  Intimate Partner Violence: Not on file  Depression (EYV7-0): Not on file  Alcohol Screen: Not on file  Housing: Not on file  Utilities: Not on file  Health Literacy: Not on file    Review of Systems  HENT:  Positive for congestion.   Respiratory:  Positive for apnea, cough and shortness of breath.   Psychiatric/Behavioral:  Positive for sleep disturbance.     Vitals:  04/03/24 1041  BP: 112/72  Pulse: 87  SpO2: 92%     Physical Exam Constitutional:      Appearance: She is obese.  HENT:     Head: Normocephalic.     Mouth/Throat:     Mouth: Mucous membranes are moist.     Comments: Crowded oropharynx Eyes:     General: No scleral icterus. Cardiovascular:     Rate and Rhythm: Normal rate and regular rhythm.     Heart sounds: No murmur heard.    No friction rub.  Pulmonary:     Effort: Pulmonary effort is normal. No respiratory distress.     Breath sounds: No stridor. No wheezing or rhonchi.  Musculoskeletal:     Cervical back: No rigidity or tenderness.  Neurological:     General: No focal deficit present.     Mental Status: She is alert.  Psychiatric:        Mood and Affect: Mood normal.    Data Reviewed: Echocardiogram 07/19/2023-normal ejection fraction, normal right ventricular systolic function  Sleep study from 2017 shows AHI of 61  PFTs show an elevated RV/TLC suggesting air trapping  Assessment:  Shortness of breath - This continues to improve  History of asthma - Continue Advair - Albuterol  as needed  Severe obstructive sleep apnea - Having some mask issues - Encouraged to follow-up with ordering a new mask that may fit a little bit better - Encouraged to continue using CPAP on a nightly basis  History of diastolic dysfunction - Most recent echo looks stable  Class III obesity - Encouraged to continue weight loss  efforts  Hypothyroidism - Continue to optimize therapy  History of major depression, generalized anxiety disorder  I believe her shortness of breath is multifactorial  Plan/Recommendations:  Continue CPAP therapy on a nightly basis  Encouraged to continue Advair, albuterol  as needed Diet regular exercise as tolerated  Encouraged to make sure she follows up with getting a new mask that may fit a little bit better  Will continue with compliance monitoring  I personally spent a total of 31 minutes in the care of the patient today including preparing to see the patient, getting/reviewing separately obtained history, performing a medically appropriate exam/evaluation, counseling and educating, documenting clinical information in the EHR, and independently interpreting results.  Jennet Epley MD [facility] 04/03/2024, 11:09 AM  CC: Sun, Vyvyan, MD   "

## 2024-04-03 NOTE — Patient Instructions (Signed)
 Follow-up in 3 months  Contact the medical supply company for changing your mask -A fullface mask that covers the nose will likely work better for you  Make sure you are putting the CPAP on nightly Make sure you are trying to get about 7 hours of sleep nightly  Let us  know if you are still having problems despite changing the mask  Continue weight loss efforts

## 2024-04-07 ENCOUNTER — Telehealth: Payer: Self-pay | Admitting: Gastroenterology

## 2024-04-07 NOTE — Telephone Encounter (Signed)
 Inbound call from patient stating that she would like to speak to the nurse in regards to what she should eat so her blood sugar does not drop. Patient rescheduled her procedure for March. Please advise.

## 2024-04-07 NOTE — Telephone Encounter (Signed)
 Spoke to patient. Patient was inquiring about what she can eat if her blood sugar drops the day before her procedure when she is supposed to be on clear liquids. Patient instructed that she can have anything that is clear: soda, tea, popsicles, juice to help keep her blood sugar within normal range or if her blood sugar drops. Instructed she can have as much of these things as she wants the day before. Patient asked if she could eat anything prior to her prep day. Patient does have her instructions given to her at her office visit. Patient instructed she can eat anything prior to her procedure other than the foods to limit 5 days prior to and the day before her procedure when she is to do clear liquids only. Patient reports that after eating a heavy meal she has bloating & asks is this normal but she forgot to ask at her office visit. Instructed that eating a heavy meal before laying down does make digestion harder. Patient also inquired if she needed to arrive early for her procedure & instructed arriving at the time she is scheduled or a few minutes before is ok. No other questions at this time.

## 2024-04-20 ENCOUNTER — Encounter: Admitting: Gastroenterology

## 2024-05-15 ENCOUNTER — Encounter: Admitting: Gastroenterology

## 2024-07-12 ENCOUNTER — Ambulatory Visit: Admitting: Pulmonary Disease
